# Patient Record
Sex: Female | Born: 1940 | Race: White | Hispanic: No | Smoking: Former smoker
Health system: Southern US, Community
[De-identification: ages and names within clinical notes are randomized; demographics above are authoritative.]

## PROBLEM LIST (undated history)

## (undated) DIAGNOSIS — Z87442 Personal history of urinary calculi: Secondary | ICD-10-CM

## (undated) DIAGNOSIS — M1990 Unspecified osteoarthritis, unspecified site: Secondary | ICD-10-CM

## (undated) DIAGNOSIS — J302 Other seasonal allergic rhinitis: Secondary | ICD-10-CM

## (undated) DIAGNOSIS — N183 Chronic kidney disease, stage 3 unspecified: Secondary | ICD-10-CM

## (undated) DIAGNOSIS — R1310 Dysphagia, unspecified: Secondary | ICD-10-CM

## (undated) DIAGNOSIS — E538 Deficiency of other specified B group vitamins: Secondary | ICD-10-CM

## (undated) DIAGNOSIS — M869 Osteomyelitis, unspecified: Secondary | ICD-10-CM

## (undated) DIAGNOSIS — G0490 Encephalitis and encephalomyelitis, unspecified: Secondary | ICD-10-CM

## (undated) DIAGNOSIS — R112 Nausea with vomiting, unspecified: Secondary | ICD-10-CM

## (undated) DIAGNOSIS — C801 Malignant (primary) neoplasm, unspecified: Secondary | ICD-10-CM

## (undated) DIAGNOSIS — Z9889 Other specified postprocedural states: Secondary | ICD-10-CM

## (undated) DIAGNOSIS — I10 Essential (primary) hypertension: Secondary | ICD-10-CM

## (undated) DIAGNOSIS — M810 Age-related osteoporosis without current pathological fracture: Secondary | ICD-10-CM

## (undated) DIAGNOSIS — K219 Gastro-esophageal reflux disease without esophagitis: Secondary | ICD-10-CM

## (undated) DIAGNOSIS — F32A Depression, unspecified: Secondary | ICD-10-CM

## (undated) DIAGNOSIS — J449 Chronic obstructive pulmonary disease, unspecified: Secondary | ICD-10-CM

## (undated) DIAGNOSIS — E785 Hyperlipidemia, unspecified: Secondary | ICD-10-CM

## (undated) DIAGNOSIS — E559 Vitamin D deficiency, unspecified: Secondary | ICD-10-CM

## (undated) HISTORY — PX: TONSILLECTOMY: SUR1361

## (undated) HISTORY — PX: BRAIN SURGERY: SHX531

## (undated) HISTORY — PX: ABDOMINAL HYSTERECTOMY: SHX81

---

## 1978-11-22 HISTORY — PX: BRAIN SURGERY: SHX531

## 2005-08-17 ENCOUNTER — Ambulatory Visit: Payer: Self-pay | Admitting: Internal Medicine

## 2007-03-01 ENCOUNTER — Ambulatory Visit: Payer: Self-pay | Admitting: Internal Medicine

## 2008-05-09 ENCOUNTER — Ambulatory Visit: Payer: Self-pay | Admitting: Internal Medicine

## 2008-06-19 ENCOUNTER — Ambulatory Visit: Payer: Self-pay | Admitting: Internal Medicine

## 2009-05-13 ENCOUNTER — Ambulatory Visit: Payer: Self-pay | Admitting: Internal Medicine

## 2009-05-27 ENCOUNTER — Ambulatory Visit: Payer: Self-pay | Admitting: Internal Medicine

## 2009-12-22 ENCOUNTER — Ambulatory Visit: Payer: Self-pay | Admitting: Internal Medicine

## 2010-05-19 ENCOUNTER — Ambulatory Visit: Payer: Self-pay | Admitting: Internal Medicine

## 2011-06-24 ENCOUNTER — Ambulatory Visit: Payer: Self-pay | Admitting: Internal Medicine

## 2012-06-28 ENCOUNTER — Ambulatory Visit: Payer: Self-pay | Admitting: Internal Medicine

## 2012-08-20 ENCOUNTER — Ambulatory Visit: Payer: Self-pay | Admitting: Internal Medicine

## 2012-12-24 ENCOUNTER — Ambulatory Visit: Payer: Self-pay | Admitting: Physician Assistant

## 2013-05-15 ENCOUNTER — Ambulatory Visit: Payer: Self-pay | Admitting: Rheumatology

## 2013-06-29 ENCOUNTER — Ambulatory Visit: Payer: Self-pay | Admitting: Internal Medicine

## 2013-07-04 ENCOUNTER — Ambulatory Visit: Payer: Self-pay | Admitting: Internal Medicine

## 2013-08-24 ENCOUNTER — Ambulatory Visit: Payer: Self-pay | Admitting: Emergency Medicine

## 2013-10-28 ENCOUNTER — Ambulatory Visit: Payer: Self-pay | Admitting: Physician Assistant

## 2013-10-28 LAB — RAPID STREP-A WITH REFLX: Micro Text Report: NEGATIVE

## 2014-07-04 DIAGNOSIS — M810 Age-related osteoporosis without current pathological fracture: Secondary | ICD-10-CM | POA: Insufficient documentation

## 2014-07-19 ENCOUNTER — Ambulatory Visit: Payer: Self-pay | Admitting: Internal Medicine

## 2014-12-31 DIAGNOSIS — H5210 Myopia, unspecified eye: Secondary | ICD-10-CM | POA: Diagnosis not present

## 2014-12-31 DIAGNOSIS — Z961 Presence of intraocular lens: Secondary | ICD-10-CM | POA: Diagnosis not present

## 2014-12-31 DIAGNOSIS — H524 Presbyopia: Secondary | ICD-10-CM | POA: Diagnosis not present

## 2015-01-25 ENCOUNTER — Observation Stay: Payer: Self-pay | Admitting: Internal Medicine

## 2015-01-25 DIAGNOSIS — F17210 Nicotine dependence, cigarettes, uncomplicated: Secondary | ICD-10-CM | POA: Diagnosis not present

## 2015-01-25 DIAGNOSIS — G9389 Other specified disorders of brain: Secondary | ICD-10-CM | POA: Diagnosis not present

## 2015-01-25 DIAGNOSIS — R42 Dizziness and giddiness: Secondary | ICD-10-CM | POA: Diagnosis not present

## 2015-01-25 DIAGNOSIS — J45909 Unspecified asthma, uncomplicated: Secondary | ICD-10-CM | POA: Diagnosis not present

## 2015-01-25 DIAGNOSIS — R209 Unspecified disturbances of skin sensation: Secondary | ICD-10-CM | POA: Diagnosis not present

## 2015-01-25 DIAGNOSIS — I10 Essential (primary) hypertension: Secondary | ICD-10-CM | POA: Diagnosis not present

## 2015-01-25 DIAGNOSIS — H539 Unspecified visual disturbance: Secondary | ICD-10-CM | POA: Diagnosis not present

## 2015-01-25 DIAGNOSIS — E785 Hyperlipidemia, unspecified: Secondary | ICD-10-CM | POA: Diagnosis not present

## 2015-01-25 DIAGNOSIS — F419 Anxiety disorder, unspecified: Secondary | ICD-10-CM | POA: Diagnosis not present

## 2015-01-25 DIAGNOSIS — R29810 Facial weakness: Secondary | ICD-10-CM | POA: Diagnosis not present

## 2015-01-25 DIAGNOSIS — R51 Headache: Secondary | ICD-10-CM | POA: Diagnosis not present

## 2015-01-25 DIAGNOSIS — K219 Gastro-esophageal reflux disease without esophagitis: Secondary | ICD-10-CM | POA: Diagnosis not present

## 2015-01-25 DIAGNOSIS — J449 Chronic obstructive pulmonary disease, unspecified: Secondary | ICD-10-CM | POA: Diagnosis not present

## 2015-01-26 DIAGNOSIS — G9389 Other specified disorders of brain: Secondary | ICD-10-CM | POA: Diagnosis not present

## 2015-01-26 DIAGNOSIS — J449 Chronic obstructive pulmonary disease, unspecified: Secondary | ICD-10-CM | POA: Diagnosis not present

## 2015-01-26 DIAGNOSIS — J45909 Unspecified asthma, uncomplicated: Secondary | ICD-10-CM | POA: Diagnosis not present

## 2015-01-26 DIAGNOSIS — R42 Dizziness and giddiness: Secondary | ICD-10-CM | POA: Diagnosis not present

## 2015-01-26 DIAGNOSIS — E785 Hyperlipidemia, unspecified: Secondary | ICD-10-CM | POA: Diagnosis not present

## 2015-01-26 DIAGNOSIS — I10 Essential (primary) hypertension: Secondary | ICD-10-CM | POA: Diagnosis not present

## 2015-01-26 DIAGNOSIS — K219 Gastro-esophageal reflux disease without esophagitis: Secondary | ICD-10-CM | POA: Diagnosis not present

## 2015-01-26 DIAGNOSIS — F17210 Nicotine dependence, cigarettes, uncomplicated: Secondary | ICD-10-CM | POA: Diagnosis not present

## 2015-01-26 DIAGNOSIS — F419 Anxiety disorder, unspecified: Secondary | ICD-10-CM | POA: Diagnosis not present

## 2015-01-26 DIAGNOSIS — G459 Transient cerebral ischemic attack, unspecified: Secondary | ICD-10-CM | POA: Diagnosis not present

## 2015-01-29 DIAGNOSIS — J449 Chronic obstructive pulmonary disease, unspecified: Secondary | ICD-10-CM | POA: Diagnosis not present

## 2015-01-29 DIAGNOSIS — I10 Essential (primary) hypertension: Secondary | ICD-10-CM | POA: Diagnosis not present

## 2015-01-29 DIAGNOSIS — M1990 Unspecified osteoarthritis, unspecified site: Secondary | ICD-10-CM | POA: Diagnosis not present

## 2015-03-03 DIAGNOSIS — M1990 Unspecified osteoarthritis, unspecified site: Secondary | ICD-10-CM | POA: Diagnosis not present

## 2015-03-03 DIAGNOSIS — J449 Chronic obstructive pulmonary disease, unspecified: Secondary | ICD-10-CM | POA: Diagnosis not present

## 2015-03-03 DIAGNOSIS — I10 Essential (primary) hypertension: Secondary | ICD-10-CM | POA: Diagnosis not present

## 2015-03-03 DIAGNOSIS — E780 Pure hypercholesterolemia: Secondary | ICD-10-CM | POA: Diagnosis not present

## 2015-03-23 NOTE — H&P (Signed)
PATIENT NAME:  Katherine Mosley, MELNIK MR#:  [id number] DATE OF BIRTH:  [birth year]  DATE OF ADMISSION:  01/25/2015  REFERRING PHYSICIAN: Wells Guiles L. Reita Cliche, MD   FAMILY PHYSICIAN: Leonie Douglas. Doy Hutching, MD   REASON FOR ADMISSION: Accelerated hypertension with severe dizziness.   HISTORY OF PRESENT ILLNESS: The patient is a 74 year old female with a significant history of previous encephalitis with resulting encephalomalacia. Also has a history of hypertension, hyperlipidemia, and asthma. Presents to the Emergency Room with severe left-sided headache associated with dizziness. In the Emergency Room, the patient's blood pressure was extremely elevated. Laboratories and CT were unremarkable. She continues to complain of dizziness and headache and is now admitted for further evaluation. She is unable to get an MRI of the brain done due to previous surgery related to her encephalitis.   PAST MEDICAL HISTORY: 1.  History of encephalitis status post craniotomy.  2.  Resulting encephalomalacia.  3.  Asthma.  4.  Benign hypertension.  5.  Hyperlipidemia.  6.  GE reflux disease.  7.  Status post hysterectomy.   MEDICATIONS: 1.  Zantac 150 mg p.o. b.i.d.  2.  Pravastatin 40 mg p.o. at bedtime.  3.  Multivitamin 1 p.o. daily.  4.  Meloxicam 7.5 mg p.o. daily.  5.  Losartan 100 mg p.o. daily.  6.  Claritin 10 mg p.o. daily.  7.  Aspirin 81 mg p.o. daily.  8.  Amlodipine 5 mg p.o. daily.  9.  Fosamax 70 mg p.o. q. week.   ALLERGIES: SULFA AND PREDNISONE.   SOCIAL HISTORY: The patient still smokes. Denies alcohol abuse.   FAMILY HISTORY: Positive for hypertension, stroke, diabetes, and coronary artery disease.   REVIEW OF SYSTEMS:  CONSTITUTIONAL: No fever or change in weight.  EYES: No blurred or double vision. No glaucoma.  ENT: No tinnitus or hearing loss. No nasal discharge or bleeding. No difficulty swallowing.  RESPIRATORY: No cough or wheezing. Denies hemoptysis.  CARDIOVASCULAR: No chest pain or  orthopnea. No palpitations or syncope.  GASTROINTESTINAL: No nausea, vomiting, or diarrhea. No abdominal pain. No change in bowel habits.  GENITOURINARY: No dysuria or hematuria. No incontinence.  ENDOCRINE: No polyuria or polydipsia. No heat or cold intolerance.  HEMATOLOGIC: The patient denies anemia, easy bruising, or bleeding.  LYMPHATIC: No swollen glands.  MUSCULOSKELETAL: The patient has pain in her neck, back, shoulders, knees, and hips. No gout.  NEUROLOGIC: Denies migraines. No previous stroke or seizures.  PSYCHOLOGICAL: The patient denies anxiety, insomnia or depression.   PHYSICAL EXAMINATION: GENERAL: The patient is in no acute distress.  VITAL SIGNS: Currently remarkable for initial blood pressure of 190/110, now 162/85 with a heart rate of 95, respiratory rate of 18, temperature of 98, saturation 98% on room air.  HEENT: Normocephalic, atraumatic. Pupils equal, round, and reactive to light and accommodation. Extraocular movements are intact. Sclerae are anicteric. Conjunctivae are clear. Oropharynx was clear.  NECK: Supple without JVD. No adenopathy or thyromegaly is noted.  LUNGS: Clear to auscultation and percussion without wheezes, rales or rhonchi. No dullness. Respiratory effort is normal.  CARDIAC: Regular rate and rhythm. Normal S1, S2. No significant rubs, murmurs or gallops. PMI is nondisplaced. Chest wall is nontender.  ABDOMEN: Soft, nontender with normoactive bowel sounds. No organomegaly or masses were appreciated. No hernias or bruits were noted.  EXTREMITIES: Without clubbing, cyanosis, edema. Pulses were 2+ bilaterally.  SKIN: Warm and dry without rash or lesions.  NEUROLOGIC: Cranial nerves II through XII grossly intact. Deep tendon reflexes were symmetric. Motor  and sensory examination is nonfocal.  PSYCHIATRIC: Revealed a patient who is alert and oriented to person, place, and time. She was cooperative and used good judgment.   LABORATORY DATA: EKG revealed  sinus rhythm with no acute ischemic changes. Head CT revealed evidence of encephalomalacia with no acute infarct or bleed. Her white count was 9.9 with a hemoglobin of 15.1. Glucose 99 with a BUN of 9, creatinine 1.07 with a GFR of 53. Troponin was less than 0.02.   ASSESSMENT: 1.  Acute headache with dizziness, worrisome for stroke.  2.  Accelerated hypertension.  3.  Encephalomalacia.  4.  Chronic obstructive pulmonary disease/asthma.  5.  Anxiety.  6.  History of encephalitis.   PLAN: The patient is unable to get an MRI of the brain due to her previous brain surgery. We will observe on telemetry overnight and monitor her blood pressure closely. We will add empirically a low-dose beta blocker. Neuro checks q. 4 hours. We will repeat head CT in the morning and discharge home if stable. Further treatment and evaluation will depend upon the patient's progress.   TOTAL TIME SPENT ON THIS PATIENT: 45 minutes.     ____________________________ Leonie Douglas Doy Hutching, MD jds:at D: 01/25/2015 15:43:12 ET T: 01/25/2015 16:02:32 ET JOB#: [id number]  cc: Leonie Douglas. Doy Hutching, MD, <Dictator> Brysun Eschmann Lennice Sites MD ELECTRONICALLY SIGNED 01/25/2015 20:22

## 2015-07-02 DIAGNOSIS — Z79899 Other long term (current) drug therapy: Secondary | ICD-10-CM | POA: Diagnosis not present

## 2015-07-02 DIAGNOSIS — E780 Pure hypercholesterolemia: Secondary | ICD-10-CM | POA: Diagnosis not present

## 2015-07-02 DIAGNOSIS — I10 Essential (primary) hypertension: Secondary | ICD-10-CM | POA: Diagnosis not present

## 2015-07-09 ENCOUNTER — Other Ambulatory Visit: Payer: Self-pay | Admitting: Internal Medicine

## 2015-07-09 DIAGNOSIS — Z0000 Encounter for general adult medical examination without abnormal findings: Secondary | ICD-10-CM | POA: Diagnosis not present

## 2015-07-09 DIAGNOSIS — E780 Pure hypercholesterolemia: Secondary | ICD-10-CM | POA: Diagnosis not present

## 2015-07-09 DIAGNOSIS — R5383 Other fatigue: Secondary | ICD-10-CM | POA: Diagnosis not present

## 2015-07-09 DIAGNOSIS — Z1231 Encounter for screening mammogram for malignant neoplasm of breast: Secondary | ICD-10-CM

## 2015-07-09 DIAGNOSIS — J431 Panlobular emphysema: Secondary | ICD-10-CM | POA: Diagnosis not present

## 2015-07-09 DIAGNOSIS — J449 Chronic obstructive pulmonary disease, unspecified: Secondary | ICD-10-CM | POA: Diagnosis not present

## 2015-07-09 DIAGNOSIS — I10 Essential (primary) hypertension: Secondary | ICD-10-CM | POA: Diagnosis not present

## 2015-07-09 DIAGNOSIS — R5381 Other malaise: Secondary | ICD-10-CM | POA: Diagnosis not present

## 2015-07-18 DIAGNOSIS — Z1211 Encounter for screening for malignant neoplasm of colon: Secondary | ICD-10-CM | POA: Diagnosis not present

## 2015-07-22 ENCOUNTER — Ambulatory Visit
Admission: RE | Admit: 2015-07-22 | Discharge: 2015-07-22 | Disposition: A | Discharge status: Home / Self Care | Payer: Commercial Managed Care - HMO | Source: Ambulatory Visit | Attending: Internal Medicine | Admitting: Internal Medicine

## 2015-07-22 DIAGNOSIS — Z1231 Encounter for screening mammogram for malignant neoplasm of breast: Secondary | ICD-10-CM | POA: Insufficient documentation

## 2015-08-18 DIAGNOSIS — M79674 Pain in right toe(s): Secondary | ICD-10-CM | POA: Diagnosis not present

## 2015-08-18 DIAGNOSIS — B351 Tinea unguium: Secondary | ICD-10-CM | POA: Diagnosis not present

## 2015-08-20 DIAGNOSIS — E559 Vitamin D deficiency, unspecified: Secondary | ICD-10-CM | POA: Diagnosis not present

## 2015-08-20 DIAGNOSIS — R5381 Other malaise: Secondary | ICD-10-CM | POA: Diagnosis not present

## 2015-08-20 DIAGNOSIS — E780 Pure hypercholesterolemia: Secondary | ICD-10-CM | POA: Diagnosis not present

## 2015-08-20 DIAGNOSIS — I10 Essential (primary) hypertension: Secondary | ICD-10-CM | POA: Diagnosis not present

## 2015-08-20 DIAGNOSIS — Z79899 Other long term (current) drug therapy: Secondary | ICD-10-CM | POA: Diagnosis not present

## 2015-08-20 DIAGNOSIS — R5383 Other fatigue: Secondary | ICD-10-CM | POA: Diagnosis not present

## 2015-10-25 ENCOUNTER — Encounter: Payer: Self-pay | Admitting: Emergency Medicine

## 2015-10-25 ENCOUNTER — Ambulatory Visit
Admission: EM | Admit: 2015-10-25 | Discharge: 2015-10-25 | Disposition: A | Discharge status: Home / Self Care | Payer: Commercial Managed Care - HMO | Attending: Family Medicine | Admitting: Family Medicine

## 2015-10-25 DIAGNOSIS — J069 Acute upper respiratory infection, unspecified: Secondary | ICD-10-CM

## 2015-10-25 HISTORY — DX: Essential (primary) hypertension: I10

## 2015-10-25 HISTORY — DX: Hyperlipidemia, unspecified: E78.5

## 2015-10-25 HISTORY — DX: Other seasonal allergic rhinitis: J30.2

## 2015-10-25 MED ORDER — GUAIFENESIN ER 600 MG PO TB12: 600.0000 mg | ORAL_TABLET | Freq: Two times a day (BID) | ORAL | Status: DC | PRN

## 2015-10-25 MED ORDER — MOMETASONE FUROATE 50 MCG/ACT NA SUSP: 2.0000 | Freq: Two times a day (BID) | NASAL | Status: DC

## 2015-10-25 NOTE — Discharge Instructions (Signed)
Upper Respiratory Infection, Adult Most upper respiratory infections (URIs) are a viral infection of the air passages leading to the lungs. A URI affects the nose, throat, and upper air passages. The most common type of URI is nasopharyngitis and is typically referred to as "the common cold." URIs run their course and usually go away on their own. Most of the time, a URI does not require medical attention, but sometimes a bacterial infection in the upper airways can follow a viral infection. This is called a secondary infection. Sinus and middle ear infections are common types of secondary upper respiratory infections. Bacterial pneumonia can also complicate a URI. A URI can worsen asthma and chronic obstructive pulmonary disease (COPD). Sometimes, these complications can require emergency medical care and may be life threatening.  CAUSES Almost all URIs are caused by viruses. A virus is a type of germ and can spread from one person to another.  RISKS FACTORS You may be at risk for a URI if:   You smoke.   You have chronic heart or lung disease.  You have a weakened defense (immune) system.   You are very young or very old.   You have nasal allergies or asthma.  You work in crowded or poorly ventilated areas.  You work in health care facilities or schools. SIGNS AND SYMPTOMS  Symptoms typically develop 2-3 days after you come in contact with a cold virus. Most viral URIs last 7-10 days. However, viral URIs from the influenza virus (flu virus) can last 14-18 days and are typically more severe. Symptoms may include:   Runny or stuffy (congested) nose.   Sneezing.   Cough.   Sore throat.   Headache.   Fatigue.   Fever.   Loss of appetite.   Pain in your forehead, behind your eyes, and over your cheekbones (sinus pain).  Muscle aches.  DIAGNOSIS  Your health care provider may diagnose a URI by:  Physical exam.  Tests to check that your symptoms are not due to  another condition such as:  Strep throat.  Sinusitis.  Pneumonia.  Asthma. TREATMENT  A URI goes away on its own with time. It cannot be cured with medicines, but medicines may be prescribed or recommended to relieve symptoms. Medicines may help:  Reduce your fever.  Reduce your cough.  Relieve nasal congestion. HOME CARE INSTRUCTIONS   Take medicines only as directed by your health care provider.   Gargle warm saltwater or take cough drops to comfort your throat as directed by your health care provider.  Use a warm mist humidifier or inhale steam from a shower to increase air moisture. This may make it easier to breathe.  Drink enough fluid to keep your urine clear or pale yellow.   Eat soups and other clear broths and maintain good nutrition.   Rest as needed.   Return to work when your temperature has returned to normal or as your health care provider advises. You may need to stay home longer to avoid infecting others. You can also use a face mask and careful hand washing to prevent spread of the virus.  Increase the usage of your inhaler if you have asthma.   Do not use any tobacco products, including cigarettes, chewing tobacco, or electronic cigarettes. If you need help quitting, ask your health care provider. PREVENTION  The best way to protect yourself from getting a cold is to practice good hygiene.   Avoid oral or hand contact with people with cold   symptoms.   Wash your hands often if contact occurs.  There is no clear evidence that vitamin C, vitamin E, echinacea, or exercise reduces the chance of developing a cold. However, it is always recommended to get plenty of rest, exercise, and practice good nutrition.  SEEK MEDICAL CARE IF:   You are getting worse rather than better.   Your symptoms are not controlled by medicine.   You have chills.  You have worsening shortness of breath.  You have brown or red mucus.  You have yellow or brown nasal  discharge.  You have pain in your face, especially when you bend forward.  You have a fever.  You have swollen neck glands.  You have pain while swallowing.  You have white areas in the back of your throat. SEEK IMMEDIATE MEDICAL CARE IF:   You have severe or persistent:  Headache.  Ear pain.  Sinus pain.  Chest pain.  You have chronic lung disease and any of the following:  Wheezing.  Prolonged cough.  Coughing up blood.  A change in your usual mucus.  You have a stiff neck.  You have changes in your:  Vision.  Hearing.  Thinking.  Mood. MAKE SURE YOU:   Understand these instructions.  Will watch your condition.  Will get help right away if you are not doing well or get worse.   This information is not intended to replace advice given to you by your health care provider. Make sure you discuss any questions you have with your health care provider.   Document Released: 05/04/2001 Document Revised: 03/25/2015 Document Reviewed: 02/13/2014 Elsevier Interactive Patient Education 2016 [employer]  

## 2015-10-25 NOTE — ED Provider Notes (Signed)
CSN: [id number]     Arrival date & time 10/25/15  1003 History   First MD Initiated Contact with Patient 10/25/15 1041     Chief Complaint  Patient presents with  . Cough   HPI  Katherine Mosley is a pleasant 74 y.o. female who presents with acute nonproductive cough, bilateral ear pressure, rhinorrhea, and sore throat. She states this started one week ago. Her current pain level is 0/10. She has fatigue but denies any myalgias. She has chills but denies any fever. She has tried Tylenol as needed as well as saline nasal spray with minimal relief. She's also been using [facility] throat drops. She did not receive of influenza vaccine this year. She has a 50+ pack year history of tobacco use. Rest seems to make her symptoms better & nothing exacerbates her symptoms.    Past Medical History  Diagnosis Date  . Hypertension   . Hyperlipidemia   . Seasonal allergies    Past Surgical History  Procedure Laterality Date  . Abdominal hysterectomy    . Tonsillectomy    . Brain surgery     History reviewed. No pertinent family history. Social History  Substance Use Topics  . Smoking status: Current Every Day Smoker  . Smokeless tobacco: Never Used  . Alcohol Use: No   OB History    No data available     Review of Systems  Constitutional: Positive for chills, activity change, appetite change and fatigue.  HENT: Positive for congestion, ear pain, postnasal drip, rhinorrhea, sneezing and sore throat. Negative for sinus pressure and tinnitus.   Eyes: Negative.   Respiratory: Positive for cough. Negative for shortness of breath and wheezing.   Cardiovascular: Negative.   Gastrointestinal: Negative.   Endocrine: Negative.   Genitourinary: Negative.   Musculoskeletal: Negative.   Skin: Negative.   Neurological: Negative.   Hematological: Negative.   Psychiatric/Behavioral: Negative.     Allergies  Prednisone and Sulfur  Home Medications   Prior to Admission medications    Medication Sig Start Date End Date Taking? Authorizing Provider  acetaminophen (TYLENOL) 500 MG tablet Take 500 mg by mouth every 6 (six) hours as needed.   Yes Historical Provider, MD  alendronate (FOSAMAX) 70 MG tablet Take 70 mg by mouth once a week. Take with a full glass of water on an empty stomach.   Yes Historical Provider, MD  aspirin 81 MG tablet Take 81 mg by mouth daily.   Yes Historical Provider, MD  calcium-vitamin D (OSCAL WITH D) 500-200 MG-UNIT tablet Take 1 tablet by mouth.   Yes Historical Provider, MD  loratadine (CLARITIN) 10 MG tablet Take 10 mg by mouth daily.   Yes Historical Provider, MD  losartan (COZAAR) 100 MG tablet Take 100 mg by mouth daily.   Yes Historical Provider, MD  metoprolol (LOPRESSOR) 50 MG tablet Take 50 mg by mouth once.   Yes Historical Provider, MD  pravastatin (PRAVACHOL) 40 MG tablet Take 40 mg by mouth daily.   Yes Historical Provider, MD  ranitidine (ZANTAC) 150 MG tablet Take 150 mg by mouth 2 (two) times daily.   Yes Historical Provider, MD  guaiFENesin (MUCINEX) 600 MG 12 hr tablet Take 1 tablet (600 mg total) by mouth 2 (two) times daily as needed. 10/25/15   Andria Meuse, NP  mometasone (NASONEX) 50 MCG/ACT nasal spray Place 2 sprays into the nose 2 (two) times daily. 10/25/15   Andria Meuse, NP   Meds Ordered and Administered this  Visit  Medications - No data to display  BP 135/65 mmHg  Pulse 60  Temp(Src) 97.5 F (36.4 C) (Tympanic)  Resp 16  Ht 5\' 1"  (1.549 m)  Wt 135 lb (61.236 kg)  BMI 25.52 kg/m2  SpO2 98% No data found.   Physical Exam  Constitutional: She is oriented to person, place, and time. She appears well-developed and well-nourished. No distress.  HENT:  Head: Normocephalic and atraumatic.  Right Ear: Hearing, external ear and ear canal normal. Tympanic membrane is injected. Tympanic membrane is not erythematous, not retracted and not bulging. No middle ear effusion.  Left Ear: Hearing, external ear and ear  canal normal. Tympanic membrane is injected. Tympanic membrane is not erythematous, not retracted and not bulging.  No middle ear effusion.  Nose: Mucosal edema and rhinorrhea present. Right sinus exhibits no maxillary sinus tenderness and no frontal sinus tenderness. Left sinus exhibits no maxillary sinus tenderness and no frontal sinus tenderness.  Mouth/Throat: Uvula is midline, oropharynx is clear and moist and mucous membranes are normal.  Eyes: Conjunctivae are normal. No scleral icterus.  Neck: Normal range of motion.  Cardiovascular: Normal rate and regular rhythm.   Pulmonary/Chest: Effort normal and breath sounds normal. No accessory muscle usage. No respiratory distress.  Abdominal: Soft. Bowel sounds are normal. She exhibits no distension.  Musculoskeletal: Normal range of motion. She exhibits no edema or tenderness.  Neurological: She is alert and oriented to person, place, and time. No cranial nerve deficit.  Skin: Skin is warm and dry. No rash noted. No erythema.  Psychiatric: Her behavior is normal. Judgment normal.  Nursing note and vitals reviewed.   ED Course  Procedures n/a  MDM   1. URI, acute    Explained to patient this is likely viral URI. Cough and symptoms may persist for up to 3 weeks at times. Continue supportive measures. Reassured and discussed warning signs for patient to return to clinic including SOB, weakness, unrelenting fever, severe pain or worsening symptoms.  Plan: Diagnosis reviewed with patient Tylenol as directed PRN Rx as per orders;  benefits, risks, potential side effects reviewed  Seek additional medical care if symptoms worsen or are not improving     Andria Meuse, NP 10/25/15 1101

## 2015-10-25 NOTE — ED Notes (Signed)
Cough, throat burning, congested, nasal drainage (Yellow sputum) for 1 week

## 2015-12-29 DIAGNOSIS — Z961 Presence of intraocular lens: Secondary | ICD-10-CM | POA: Diagnosis not present

## 2015-12-29 DIAGNOSIS — H524 Presbyopia: Secondary | ICD-10-CM | POA: Diagnosis not present

## 2015-12-29 DIAGNOSIS — H5210 Myopia, unspecified eye: Secondary | ICD-10-CM | POA: Diagnosis not present

## 2016-01-13 DIAGNOSIS — E7800 Pure hypercholesterolemia, unspecified: Secondary | ICD-10-CM | POA: Diagnosis not present

## 2016-01-13 DIAGNOSIS — Z79899 Other long term (current) drug therapy: Secondary | ICD-10-CM | POA: Diagnosis not present

## 2016-01-13 DIAGNOSIS — R5381 Other malaise: Secondary | ICD-10-CM | POA: Diagnosis not present

## 2016-01-13 DIAGNOSIS — E559 Vitamin D deficiency, unspecified: Secondary | ICD-10-CM | POA: Diagnosis not present

## 2016-01-13 DIAGNOSIS — E538 Deficiency of other specified B group vitamins: Secondary | ICD-10-CM | POA: Diagnosis not present

## 2016-01-13 DIAGNOSIS — I10 Essential (primary) hypertension: Secondary | ICD-10-CM | POA: Diagnosis not present

## 2016-01-13 DIAGNOSIS — R5383 Other fatigue: Secondary | ICD-10-CM | POA: Diagnosis not present

## 2016-01-20 DIAGNOSIS — Z79899 Other long term (current) drug therapy: Secondary | ICD-10-CM | POA: Diagnosis not present

## 2016-01-20 DIAGNOSIS — E7800 Pure hypercholesterolemia, unspecified: Secondary | ICD-10-CM | POA: Diagnosis not present

## 2016-01-20 DIAGNOSIS — E538 Deficiency of other specified B group vitamins: Secondary | ICD-10-CM | POA: Diagnosis not present

## 2016-01-20 DIAGNOSIS — I10 Essential (primary) hypertension: Secondary | ICD-10-CM | POA: Diagnosis not present

## 2016-01-20 DIAGNOSIS — E559 Vitamin D deficiency, unspecified: Secondary | ICD-10-CM | POA: Diagnosis not present

## 2016-01-20 DIAGNOSIS — J431 Panlobular emphysema: Secondary | ICD-10-CM | POA: Diagnosis not present

## 2016-01-20 DIAGNOSIS — M1990 Unspecified osteoarthritis, unspecified site: Secondary | ICD-10-CM | POA: Diagnosis not present

## 2016-04-12 DIAGNOSIS — E7800 Pure hypercholesterolemia, unspecified: Secondary | ICD-10-CM | POA: Diagnosis not present

## 2016-04-12 DIAGNOSIS — Z79899 Other long term (current) drug therapy: Secondary | ICD-10-CM | POA: Diagnosis not present

## 2016-04-12 DIAGNOSIS — E559 Vitamin D deficiency, unspecified: Secondary | ICD-10-CM | POA: Diagnosis not present

## 2016-04-12 DIAGNOSIS — E538 Deficiency of other specified B group vitamins: Secondary | ICD-10-CM | POA: Diagnosis not present

## 2016-04-20 DIAGNOSIS — E7800 Pure hypercholesterolemia, unspecified: Secondary | ICD-10-CM | POA: Diagnosis not present

## 2016-04-20 DIAGNOSIS — Z1239 Encounter for other screening for malignant neoplasm of breast: Secondary | ICD-10-CM | POA: Diagnosis not present

## 2016-04-20 DIAGNOSIS — M1990 Unspecified osteoarthritis, unspecified site: Secondary | ICD-10-CM | POA: Diagnosis not present

## 2016-04-20 DIAGNOSIS — J431 Panlobular emphysema: Secondary | ICD-10-CM | POA: Diagnosis not present

## 2016-04-20 DIAGNOSIS — K219 Gastro-esophageal reflux disease without esophagitis: Secondary | ICD-10-CM | POA: Diagnosis not present

## 2016-04-20 DIAGNOSIS — Z79899 Other long term (current) drug therapy: Secondary | ICD-10-CM | POA: Diagnosis not present

## 2016-04-20 DIAGNOSIS — I10 Essential (primary) hypertension: Secondary | ICD-10-CM | POA: Diagnosis not present

## 2016-07-16 DIAGNOSIS — Z79899 Other long term (current) drug therapy: Secondary | ICD-10-CM | POA: Diagnosis not present

## 2016-07-16 DIAGNOSIS — I10 Essential (primary) hypertension: Secondary | ICD-10-CM | POA: Diagnosis not present

## 2016-07-16 DIAGNOSIS — E7800 Pure hypercholesterolemia, unspecified: Secondary | ICD-10-CM | POA: Diagnosis not present

## 2016-07-23 DIAGNOSIS — Z0000 Encounter for general adult medical examination without abnormal findings: Secondary | ICD-10-CM | POA: Diagnosis not present

## 2016-07-23 DIAGNOSIS — I10 Essential (primary) hypertension: Secondary | ICD-10-CM | POA: Diagnosis not present

## 2016-07-23 DIAGNOSIS — Z1211 Encounter for screening for malignant neoplasm of colon: Secondary | ICD-10-CM | POA: Diagnosis not present

## 2016-07-23 DIAGNOSIS — J431 Panlobular emphysema: Secondary | ICD-10-CM | POA: Diagnosis not present

## 2016-07-23 DIAGNOSIS — E7800 Pure hypercholesterolemia, unspecified: Secondary | ICD-10-CM | POA: Diagnosis not present

## 2016-07-23 DIAGNOSIS — Z79899 Other long term (current) drug therapy: Secondary | ICD-10-CM | POA: Diagnosis not present

## 2016-07-30 DIAGNOSIS — Z1211 Encounter for screening for malignant neoplasm of colon: Secondary | ICD-10-CM | POA: Diagnosis not present

## 2016-08-02 ENCOUNTER — Other Ambulatory Visit: Payer: Self-pay | Admitting: Internal Medicine

## 2016-08-02 DIAGNOSIS — Z1231 Encounter for screening mammogram for malignant neoplasm of breast: Secondary | ICD-10-CM

## 2016-08-20 ENCOUNTER — Ambulatory Visit
Admission: RE | Admit: 2016-08-20 | Discharge: 2016-08-20 | Disposition: A | Discharge status: Home / Self Care | Payer: Commercial Managed Care - HMO | Source: Ambulatory Visit | Attending: Internal Medicine | Admitting: Internal Medicine

## 2016-08-20 DIAGNOSIS — Z1231 Encounter for screening mammogram for malignant neoplasm of breast: Secondary | ICD-10-CM | POA: Diagnosis not present

## 2016-11-09 DIAGNOSIS — S6992XA Unspecified injury of left wrist, hand and finger(s), initial encounter: Secondary | ICD-10-CM | POA: Diagnosis not present

## 2016-11-09 DIAGNOSIS — I10 Essential (primary) hypertension: Secondary | ICD-10-CM | POA: Diagnosis not present

## 2016-11-29 DIAGNOSIS — M5442 Lumbago with sciatica, left side: Secondary | ICD-10-CM | POA: Diagnosis not present

## 2016-11-29 DIAGNOSIS — M545 Low back pain: Secondary | ICD-10-CM | POA: Diagnosis not present

## 2016-11-29 DIAGNOSIS — I10 Essential (primary) hypertension: Secondary | ICD-10-CM | POA: Diagnosis not present

## 2016-12-13 DIAGNOSIS — I10 Essential (primary) hypertension: Secondary | ICD-10-CM | POA: Diagnosis not present

## 2017-01-11 DIAGNOSIS — J329 Chronic sinusitis, unspecified: Secondary | ICD-10-CM | POA: Diagnosis not present

## 2017-01-11 DIAGNOSIS — R21 Rash and other nonspecific skin eruption: Secondary | ICD-10-CM | POA: Diagnosis not present

## 2017-01-13 DIAGNOSIS — E7800 Pure hypercholesterolemia, unspecified: Secondary | ICD-10-CM | POA: Diagnosis not present

## 2017-01-13 DIAGNOSIS — I10 Essential (primary) hypertension: Secondary | ICD-10-CM | POA: Diagnosis not present

## 2017-01-13 DIAGNOSIS — Z79899 Other long term (current) drug therapy: Secondary | ICD-10-CM | POA: Diagnosis not present

## 2017-01-20 DIAGNOSIS — I10 Essential (primary) hypertension: Secondary | ICD-10-CM | POA: Diagnosis not present

## 2017-01-20 DIAGNOSIS — E538 Deficiency of other specified B group vitamins: Secondary | ICD-10-CM | POA: Diagnosis not present

## 2017-01-20 DIAGNOSIS — E559 Vitamin D deficiency, unspecified: Secondary | ICD-10-CM | POA: Diagnosis not present

## 2017-01-20 DIAGNOSIS — J431 Panlobular emphysema: Secondary | ICD-10-CM | POA: Diagnosis not present

## 2017-01-20 DIAGNOSIS — E7800 Pure hypercholesterolemia, unspecified: Secondary | ICD-10-CM | POA: Diagnosis not present

## 2017-01-20 DIAGNOSIS — Z79899 Other long term (current) drug therapy: Secondary | ICD-10-CM | POA: Diagnosis not present

## 2017-02-16 DIAGNOSIS — H524 Presbyopia: Secondary | ICD-10-CM | POA: Diagnosis not present

## 2017-02-16 DIAGNOSIS — Z961 Presence of intraocular lens: Secondary | ICD-10-CM | POA: Diagnosis not present

## 2017-03-01 DIAGNOSIS — L918 Other hypertrophic disorders of the skin: Secondary | ICD-10-CM | POA: Diagnosis not present

## 2017-03-01 DIAGNOSIS — D485 Neoplasm of uncertain behavior of skin: Secondary | ICD-10-CM | POA: Diagnosis not present

## 2017-03-01 DIAGNOSIS — C44319 Basal cell carcinoma of skin of other parts of face: Secondary | ICD-10-CM | POA: Diagnosis not present

## 2017-03-18 DIAGNOSIS — L578 Other skin changes due to chronic exposure to nonionizing radiation: Secondary | ICD-10-CM | POA: Diagnosis not present

## 2017-03-18 DIAGNOSIS — C44319 Basal cell carcinoma of skin of other parts of face: Secondary | ICD-10-CM | POA: Diagnosis not present

## 2017-03-18 DIAGNOSIS — D2311 Other benign neoplasm of skin of right eyelid, including canthus: Secondary | ICD-10-CM | POA: Diagnosis not present

## 2017-03-18 DIAGNOSIS — H029 Unspecified disorder of eyelid: Secondary | ICD-10-CM | POA: Diagnosis not present

## 2017-04-06 DIAGNOSIS — C44112 Basal cell carcinoma of skin of right eyelid, including canthus: Secondary | ICD-10-CM | POA: Diagnosis not present

## 2017-04-06 DIAGNOSIS — Z85828 Personal history of other malignant neoplasm of skin: Secondary | ICD-10-CM | POA: Diagnosis not present

## 2017-04-07 DIAGNOSIS — L578 Other skin changes due to chronic exposure to nonionizing radiation: Secondary | ICD-10-CM | POA: Diagnosis not present

## 2017-04-07 DIAGNOSIS — C44319 Basal cell carcinoma of skin of other parts of face: Secondary | ICD-10-CM | POA: Diagnosis not present

## 2017-07-11 DIAGNOSIS — Z85828 Personal history of other malignant neoplasm of skin: Secondary | ICD-10-CM | POA: Diagnosis not present

## 2017-07-11 DIAGNOSIS — Z9889 Other specified postprocedural states: Secondary | ICD-10-CM | POA: Diagnosis not present

## 2017-07-11 DIAGNOSIS — C44319 Basal cell carcinoma of skin of other parts of face: Secondary | ICD-10-CM | POA: Diagnosis not present

## 2017-07-11 DIAGNOSIS — H02812 Retained foreign body in right lower eyelid: Secondary | ICD-10-CM | POA: Diagnosis not present

## 2017-07-20 DIAGNOSIS — E538 Deficiency of other specified B group vitamins: Secondary | ICD-10-CM | POA: Diagnosis not present

## 2017-07-20 DIAGNOSIS — Z79899 Other long term (current) drug therapy: Secondary | ICD-10-CM | POA: Diagnosis not present

## 2017-07-20 DIAGNOSIS — E7800 Pure hypercholesterolemia, unspecified: Secondary | ICD-10-CM | POA: Diagnosis not present

## 2017-07-20 DIAGNOSIS — I10 Essential (primary) hypertension: Secondary | ICD-10-CM | POA: Diagnosis not present

## 2017-07-20 DIAGNOSIS — E559 Vitamin D deficiency, unspecified: Secondary | ICD-10-CM | POA: Diagnosis not present

## 2017-07-27 DIAGNOSIS — Z1231 Encounter for screening mammogram for malignant neoplasm of breast: Secondary | ICD-10-CM | POA: Diagnosis not present

## 2017-07-27 DIAGNOSIS — J431 Panlobular emphysema: Secondary | ICD-10-CM | POA: Diagnosis not present

## 2017-07-27 DIAGNOSIS — E559 Vitamin D deficiency, unspecified: Secondary | ICD-10-CM | POA: Diagnosis not present

## 2017-07-27 DIAGNOSIS — E538 Deficiency of other specified B group vitamins: Secondary | ICD-10-CM | POA: Diagnosis not present

## 2017-07-27 DIAGNOSIS — I10 Essential (primary) hypertension: Secondary | ICD-10-CM | POA: Diagnosis not present

## 2017-07-27 DIAGNOSIS — Z0000 Encounter for general adult medical examination without abnormal findings: Secondary | ICD-10-CM | POA: Diagnosis not present

## 2017-07-27 DIAGNOSIS — J449 Chronic obstructive pulmonary disease, unspecified: Secondary | ICD-10-CM | POA: Diagnosis not present

## 2017-07-27 DIAGNOSIS — Z1211 Encounter for screening for malignant neoplasm of colon: Secondary | ICD-10-CM | POA: Diagnosis not present

## 2017-07-27 DIAGNOSIS — E7800 Pure hypercholesterolemia, unspecified: Secondary | ICD-10-CM | POA: Diagnosis not present

## 2017-07-27 DIAGNOSIS — M25561 Pain in right knee: Secondary | ICD-10-CM | POA: Diagnosis not present

## 2017-08-02 ENCOUNTER — Other Ambulatory Visit: Payer: Self-pay | Admitting: Internal Medicine

## 2017-08-02 DIAGNOSIS — Z1231 Encounter for screening mammogram for malignant neoplasm of breast: Secondary | ICD-10-CM

## 2017-08-08 DIAGNOSIS — Z1211 Encounter for screening for malignant neoplasm of colon: Secondary | ICD-10-CM | POA: Diagnosis not present

## 2017-08-22 ENCOUNTER — Ambulatory Visit
Admission: RE | Admit: 2017-08-22 | Discharge: 2017-08-22 | Disposition: A | Discharge status: Home / Self Care | Payer: Commercial Managed Care - HMO | Source: Ambulatory Visit | Attending: Internal Medicine | Admitting: Internal Medicine

## 2017-08-22 DIAGNOSIS — Z1231 Encounter for screening mammogram for malignant neoplasm of breast: Secondary | ICD-10-CM | POA: Insufficient documentation

## 2017-08-22 HISTORY — DX: Malignant (primary) neoplasm, unspecified: C80.1

## 2017-10-19 DIAGNOSIS — I10 Essential (primary) hypertension: Secondary | ICD-10-CM | POA: Diagnosis not present

## 2017-10-19 DIAGNOSIS — E7800 Pure hypercholesterolemia, unspecified: Secondary | ICD-10-CM | POA: Diagnosis not present

## 2017-10-26 DIAGNOSIS — E7800 Pure hypercholesterolemia, unspecified: Secondary | ICD-10-CM | POA: Diagnosis not present

## 2017-10-26 DIAGNOSIS — E538 Deficiency of other specified B group vitamins: Secondary | ICD-10-CM | POA: Diagnosis not present

## 2017-10-26 DIAGNOSIS — E559 Vitamin D deficiency, unspecified: Secondary | ICD-10-CM | POA: Diagnosis not present

## 2017-10-26 DIAGNOSIS — I10 Essential (primary) hypertension: Secondary | ICD-10-CM | POA: Diagnosis not present

## 2017-10-26 DIAGNOSIS — N183 Chronic kidney disease, stage 3 (moderate): Secondary | ICD-10-CM | POA: Diagnosis not present

## 2017-10-26 DIAGNOSIS — J431 Panlobular emphysema: Secondary | ICD-10-CM | POA: Diagnosis not present

## 2017-10-26 DIAGNOSIS — Z79899 Other long term (current) drug therapy: Secondary | ICD-10-CM | POA: Diagnosis not present

## 2018-01-20 DIAGNOSIS — I10 Essential (primary) hypertension: Secondary | ICD-10-CM | POA: Diagnosis not present

## 2018-01-20 DIAGNOSIS — E7800 Pure hypercholesterolemia, unspecified: Secondary | ICD-10-CM | POA: Diagnosis not present

## 2018-01-20 DIAGNOSIS — Z79899 Other long term (current) drug therapy: Secondary | ICD-10-CM | POA: Diagnosis not present

## 2018-01-27 DIAGNOSIS — I10 Essential (primary) hypertension: Secondary | ICD-10-CM | POA: Diagnosis not present

## 2018-01-27 DIAGNOSIS — E538 Deficiency of other specified B group vitamins: Secondary | ICD-10-CM | POA: Diagnosis not present

## 2018-01-27 DIAGNOSIS — Z0000 Encounter for general adult medical examination without abnormal findings: Secondary | ICD-10-CM | POA: Diagnosis not present

## 2018-01-27 DIAGNOSIS — J449 Chronic obstructive pulmonary disease, unspecified: Secondary | ICD-10-CM | POA: Diagnosis not present

## 2018-01-27 DIAGNOSIS — Z8041 Family history of malignant neoplasm of ovary: Secondary | ICD-10-CM | POA: Diagnosis not present

## 2018-01-27 DIAGNOSIS — E7800 Pure hypercholesterolemia, unspecified: Secondary | ICD-10-CM | POA: Diagnosis not present

## 2018-01-27 DIAGNOSIS — L989 Disorder of the skin and subcutaneous tissue, unspecified: Secondary | ICD-10-CM | POA: Diagnosis not present

## 2018-01-27 DIAGNOSIS — E559 Vitamin D deficiency, unspecified: Secondary | ICD-10-CM | POA: Diagnosis not present

## 2018-01-27 DIAGNOSIS — J431 Panlobular emphysema: Secondary | ICD-10-CM | POA: Diagnosis not present

## 2018-02-16 DIAGNOSIS — S29012A Strain of muscle and tendon of back wall of thorax, initial encounter: Secondary | ICD-10-CM | POA: Diagnosis not present

## 2018-02-16 DIAGNOSIS — R0789 Other chest pain: Secondary | ICD-10-CM | POA: Diagnosis not present

## 2018-02-18 ENCOUNTER — Emergency Department
Admission: EM | Admit: 2018-02-18 | Discharge: 2018-02-18 | Disposition: A | Discharge status: Home / Self Care | Payer: Medicare HMO | Attending: Emergency Medicine | Admitting: Emergency Medicine

## 2018-02-18 ENCOUNTER — Other Ambulatory Visit: Payer: Self-pay

## 2018-02-18 ENCOUNTER — Encounter: Payer: Self-pay | Admitting: Emergency Medicine

## 2018-02-18 DIAGNOSIS — M5412 Radiculopathy, cervical region: Secondary | ICD-10-CM

## 2018-02-18 DIAGNOSIS — Z7902 Long term (current) use of antithrombotics/antiplatelets: Secondary | ICD-10-CM | POA: Insufficient documentation

## 2018-02-18 DIAGNOSIS — I10 Essential (primary) hypertension: Secondary | ICD-10-CM | POA: Diagnosis not present

## 2018-02-18 DIAGNOSIS — Z79899 Other long term (current) drug therapy: Secondary | ICD-10-CM | POA: Insufficient documentation

## 2018-02-18 DIAGNOSIS — M5030 Other cervical disc degeneration, unspecified cervical region: Secondary | ICD-10-CM

## 2018-02-18 DIAGNOSIS — Z7982 Long term (current) use of aspirin: Secondary | ICD-10-CM | POA: Diagnosis not present

## 2018-02-18 DIAGNOSIS — Z85828 Personal history of other malignant neoplasm of skin: Secondary | ICD-10-CM | POA: Diagnosis not present

## 2018-02-18 DIAGNOSIS — F17210 Nicotine dependence, cigarettes, uncomplicated: Secondary | ICD-10-CM | POA: Insufficient documentation

## 2018-02-18 DIAGNOSIS — M542 Cervicalgia: Secondary | ICD-10-CM | POA: Diagnosis not present

## 2018-02-18 MED ORDER — ONDANSETRON 4 MG PO TBDP
4.0000 mg | ORAL_TABLET | Freq: Once | ORAL | Status: AC
  Administered 2018-02-18: 4 mg via ORAL
  Filled 2018-02-18: qty 1

## 2018-02-18 MED ORDER — PREDNISONE 10 MG PO TABS: 10.0000 mg | ORAL_TABLET | Freq: Every day | ORAL | 0 refills | Status: DC

## 2018-02-18 MED ORDER — ONDANSETRON HCL 4 MG PO TABS: 4.0000 mg | ORAL_TABLET | Freq: Every day | ORAL | 0 refills | Status: AC | PRN

## 2018-02-18 MED ORDER — TRAMADOL HCL 50 MG PO TABS: 50.0000 mg | ORAL_TABLET | Freq: Four times a day (QID) | ORAL | 0 refills | Status: DC | PRN

## 2018-02-18 MED ORDER — DEXAMETHASONE SODIUM PHOSPHATE 10 MG/ML IJ SOLN
10.0000 mg | Freq: Once | INTRAMUSCULAR | Status: AC
  Administered 2018-02-18: 10 mg via INTRAMUSCULAR
  Filled 2018-02-18: qty 1

## 2018-02-18 NOTE — ED Provider Notes (Signed)
[facility] EMERGENCY DEPARTMENT Provider Note   CSN: [id number] Arrival date & time: 02/18/18  1906     History   Chief Complaint Chief Complaint  Patient presents with  . Neck Pain    HPI Katherine Mosley is a 77 y.o. female presents to the emergency department for evaluation of right-sided neck pain burning numbness and tingling radiating down her right triceps, ulnar aspect of the forearm and last 2 digits of the right hand.  Symptoms been present since Thursday of last week.  No trauma or injury.  CT scan of the cervical spine from 2014 shows diffuse degenerative disc disease with uncovertebral spurring with mild neuroforaminal narrowing.  She has a history of cervical radiculopathy affecting the left upper extremity in the past which responded well to conservative treatment with PT.  Patient's pain is moderate.  She has been taking Tylenol for pain.  Pain is increased with cervical extension, she gets relief with cervical flexion.  Was given muscle relaxers by walk-in clinic couple days ago which caused her to have nausea.  She denies any chest pain, shortness of breath.  No trauma or injury.  No weakness in the upper extremities.  HPI  Past Medical History:  Diagnosis Date  . 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 Hyperlipidemia   . Hypertension   . Seasonal allergies     There are no active problems to display for this patient.   Past Surgical History:  Procedure Laterality Date  . ABDOMINAL HYSTERECTOMY    . BRAIN SURGERY    . TONSILLECTOMY       OB History   None      Home Medications    Prior to Admission medications   Medication Sig Start Date End Date Taking? Authorizing Provider  acetaminophen (TYLENOL) 500 MG tablet Take 500 mg by mouth every 6 (six) hours as needed.    [provider]  alendronate (FOSAMAX) 70 MG tablet Take 70 mg by mouth once a week. Take with a full glass of water on an empty  stomach.    [provider]  aspirin 81 MG tablet Take 81 mg by mouth daily.    [provider]  calcium-vitamin D (OSCAL WITH D) 500-200 MG-UNIT tablet Take 1 tablet by mouth.    [provider]  guaiFENesin (MUCINEX) 600 MG 12 hr tablet Take 1 tablet (600 mg total) by mouth 2 (two) times daily as needed. 10/25/15   Andria Meuse, NP  loratadine (CLARITIN) 10 MG tablet Take 10 mg by mouth daily.    [provider]  losartan (COZAAR) 100 MG tablet Take 100 mg by mouth daily.    [provider]  metoprolol (LOPRESSOR) 50 MG tablet Take 50 mg by mouth once.    [provider]  mometasone (NASONEX) 50 MCG/ACT nasal spray Place 2 sprays into the nose 2 (two) times daily. 10/25/15   Andria Meuse, NP  ondansetron (ZOFRAN) 4 MG tablet Take 1 tablet (4 mg total) by mouth daily as needed for nausea or vomiting. 02/18/18 02/18/19  Duanne Guess, PA-C  pravastatin (PRAVACHOL) 40 MG tablet Take 40 mg by mouth daily.    [provider]  predniSONE (DELTASONE) 10 MG tablet Take 1 tablet (10 mg total) by mouth daily. 6,5,4,3,2,1 six day taper 02/18/18   Duanne Guess, PA-C  ranitidine (ZANTAC) 150 MG tablet Take 150 mg by mouth 2 (two)  times daily.    [provider]  traMADol (ULTRAM) 50 MG tablet Take 1 tablet (50 mg total) by mouth every 6 (six) hours as needed. 02/18/18   Duanne Guess, PA-C    Family History Family History  Problem Relation Age of Onset  . Breast cancer Neg Hx     Social History Social History   Tobacco Use  . Smoking status: Current Every Day Smoker    Packs/day: 0.50  . Smokeless tobacco: Never Used  Substance Use Topics  . Alcohol use: No  . Drug use: No     Allergies   Prednisone and Sulfur   Review of Systems Review of Systems  Constitutional: Negative for activity change.  Eyes: Negative for pain and visual disturbance.  Respiratory: Negative for shortness of breath.     Cardiovascular: Negative for chest pain and leg swelling.  Gastrointestinal: Negative for abdominal pain.  Genitourinary: Negative for flank pain and pelvic pain.  Musculoskeletal: Positive for neck pain. Negative for arthralgias, gait problem, joint swelling, myalgias and neck stiffness.  Skin: Negative for wound.  Neurological: Positive for numbness. Negative for dizziness, syncope, weakness, light-headedness and headaches.  Psychiatric/Behavioral: Negative for confusion and decreased concentration.     Physical Exam Updated Vital Signs BP (!) 123/58 (BP Location: Left Arm)   Pulse 89   Temp 97.8 F (36.6 C) (Oral)   Resp 18   Ht 5\' 1"  (1.549 m)   Wt 68 kg (150 lb)   SpO2 96%   BMI 28.34 kg/m   Physical Exam  Constitutional: She is oriented to person, place, and time. She appears well-developed and well-nourished.  HENT:  Head: Normocephalic and atraumatic.  Eyes: Conjunctivae are normal.  Neck: Normal range of motion.  Cardiovascular: Normal rate.  Pulmonary/Chest: Effort normal. No respiratory distress.  Musculoskeletal:  Cervical Spine: Examination of the cervical spine reveals no bony abnormality, no edema, and no ecchymosis.  There is no step-off.  The patient has full active and passive range of motion of the cervical spine with flexion, extension, and right and left bend with rotation.  There is no crepitus with range of motion exercises.  The patient is non-tender along the spinous process to palpation.  The patient has no paravertebral muscle spasm.  There is no parascapular discomfort.  The patient has a negative axial compression test.  The patient has a positive Spurling's test.   Right and left upper Extremity: Examination of the right and left shoulder and arm showed no bony abnormality or edema.  The patient has normal active and passive motion with abduction, flexion, internal rotation, and external rotation.  The patient has no tenderness with motion.  The  patient has a negative Hawkins test and a negative impingement test.  The patient has a negative drop arm test.  The patient is non-tender along the deltoid muscle.  There is no subacromial space tenderness with no AC joint tenderness.  The patient has no instability of the shoulder with anterior-posterior motion.  There is a negative sulcus sign.  The rotator cuff muscle strength is 5/5 with supraspinatus, 5/5 with internal rotation, and 5/5 with external rotation.  There is no crepitus with range of motion activities.     Neurological: She is alert and oriented to person, place, and time.  Skin: Skin is warm. No rash noted.  Psychiatric: She has a normal mood and affect. Her behavior is normal. Thought content normal.     ED Treatments / Results  Labs (  all labs ordered are listed, but only abnormal results are displayed) Labs Reviewed - No data to display  EKG None  Radiology No results found.  Procedures Procedures (including critical care time)  Medications Ordered in ED Medications  ondansetron (ZOFRAN-ODT) disintegrating tablet 4 mg (4 mg Oral Given 02/18/18 2125)  dexamethasone (DECADRON) injection 10 mg (10 mg Intramuscular Given 02/18/18 2125)     Initial Impression / Assessment and Plan / ED Course  I have reviewed the triage vital signs and the nursing notes.  Pertinent labs & imaging results that were available during my care of the patient were reviewed by me and considered in my medical decision making (see chart for details).     77 year old female with right sided cervical radiculopathy.  Symptoms increased with cervical extension but she gets relief with cervical flexion.  No weakness noted.  No trauma or injury.  No spinous process tenderness.  She has a history of cervical degenerative disc disease and uncovertebral spurring.  Slight elevation in creatinine so we will avoid NSAIDs.  She is given a 6-day Medrol Dosepak.  She is given tramadol as needed for more  severe pain.  She will follow-up with orthopedics if no improvement 1 week.  Final Clinical Impressions(s) / ED Diagnoses   Final diagnoses:  Right cervical radiculopathy  DDD (degenerative disc disease), cervical    ED Discharge Orders        Ordered    ondansetron (ZOFRAN) 4 MG tablet  Daily PRN     02/18/18 2221    predniSONE (DELTASONE) 10 MG tablet  Daily     02/18/18 2221    traMADol (ULTRAM) 50 MG tablet  Every 6 hours PRN     02/18/18 2221       Duanne Guess, PA-C 02/18/18 [place], [place], MD 02/19/18 [phone]

## 2018-02-18 NOTE — ED Triage Notes (Signed)
Pt arrives ambulatory to triage with c/o neck and upper back pain since Wednesday. Pt was seen at the walk kin clinic on Thursday where she was diagnosed with muscle strain. Pt denies injury or trauma at this time and is in NAD.

## 2018-02-18 NOTE — Discharge Instructions (Addendum)
Please take prednisone as prescribed.  Use Zofran as needed for nausea.  Use tramadol as needed for moderate to severe pain.  Follow-up with orthopedics or neurosurgery in 1 week if no improvement.  Return to the ER for any worsening symptoms or no changes in health.

## 2018-02-21 DIAGNOSIS — I10 Essential (primary) hypertension: Secondary | ICD-10-CM | POA: Diagnosis not present

## 2018-02-21 DIAGNOSIS — M5030 Other cervical disc degeneration, unspecified cervical region: Secondary | ICD-10-CM | POA: Diagnosis not present

## 2018-02-21 DIAGNOSIS — J431 Panlobular emphysema: Secondary | ICD-10-CM | POA: Diagnosis not present

## 2018-02-21 DIAGNOSIS — M542 Cervicalgia: Secondary | ICD-10-CM | POA: Diagnosis not present

## 2018-02-24 ENCOUNTER — Other Ambulatory Visit: Payer: Self-pay | Admitting: Physical Medicine and Rehabilitation

## 2018-02-24 DIAGNOSIS — M5030 Other cervical disc degeneration, unspecified cervical region: Secondary | ICD-10-CM | POA: Diagnosis not present

## 2018-02-24 DIAGNOSIS — M5412 Radiculopathy, cervical region: Secondary | ICD-10-CM

## 2018-02-28 DIAGNOSIS — M5412 Radiculopathy, cervical region: Secondary | ICD-10-CM | POA: Diagnosis not present

## 2018-03-01 ENCOUNTER — Ambulatory Visit
Admission: RE | Admit: 2018-03-01 | Discharge: 2018-03-01 | Disposition: A | Discharge status: Home / Self Care | Payer: Medicare HMO | Source: Ambulatory Visit | Attending: Physical Medicine and Rehabilitation | Admitting: Physical Medicine and Rehabilitation

## 2018-03-01 DIAGNOSIS — M5412 Radiculopathy, cervical region: Secondary | ICD-10-CM

## 2018-03-01 DIAGNOSIS — M4722 Other spondylosis with radiculopathy, cervical region: Secondary | ICD-10-CM | POA: Diagnosis not present

## 2018-03-01 DIAGNOSIS — M50123 Cervical disc disorder at C6-C7 level with radiculopathy: Secondary | ICD-10-CM | POA: Diagnosis not present

## 2018-03-03 ENCOUNTER — Ambulatory Visit: Payer: Medicare HMO

## 2018-03-31 DIAGNOSIS — M5030 Other cervical disc degeneration, unspecified cervical region: Secondary | ICD-10-CM | POA: Diagnosis not present

## 2018-03-31 DIAGNOSIS — M5412 Radiculopathy, cervical region: Secondary | ICD-10-CM | POA: Diagnosis not present

## 2018-04-28 DIAGNOSIS — M5412 Radiculopathy, cervical region: Secondary | ICD-10-CM | POA: Diagnosis not present

## 2018-04-28 DIAGNOSIS — J431 Panlobular emphysema: Secondary | ICD-10-CM | POA: Diagnosis not present

## 2018-04-28 DIAGNOSIS — M5030 Other cervical disc degeneration, unspecified cervical region: Secondary | ICD-10-CM | POA: Diagnosis not present

## 2018-04-28 DIAGNOSIS — R29898 Other symptoms and signs involving the musculoskeletal system: Secondary | ICD-10-CM | POA: Diagnosis not present

## 2018-04-28 DIAGNOSIS — E7800 Pure hypercholesterolemia, unspecified: Secondary | ICD-10-CM | POA: Diagnosis not present

## 2018-04-28 DIAGNOSIS — Z79899 Other long term (current) drug therapy: Secondary | ICD-10-CM | POA: Diagnosis not present

## 2018-04-28 DIAGNOSIS — I10 Essential (primary) hypertension: Secondary | ICD-10-CM | POA: Diagnosis not present

## 2018-05-01 ENCOUNTER — Other Ambulatory Visit: Payer: Self-pay | Admitting: Internal Medicine

## 2018-05-01 DIAGNOSIS — R29898 Other symptoms and signs involving the musculoskeletal system: Secondary | ICD-10-CM

## 2018-05-08 ENCOUNTER — Ambulatory Visit
Admission: RE | Admit: 2018-05-08 | Discharge: 2018-05-08 | Disposition: A | Discharge status: Home / Self Care | Payer: Medicare HMO | Source: Ambulatory Visit | Attending: Internal Medicine | Admitting: Internal Medicine

## 2018-05-08 DIAGNOSIS — R29898 Other symptoms and signs involving the musculoskeletal system: Secondary | ICD-10-CM | POA: Diagnosis not present

## 2018-05-08 DIAGNOSIS — G9389 Other specified disorders of brain: Secondary | ICD-10-CM | POA: Insufficient documentation

## 2018-05-08 DIAGNOSIS — Z9889 Other specified postprocedural states: Secondary | ICD-10-CM | POA: Insufficient documentation

## 2018-05-08 DIAGNOSIS — R200 Anesthesia of skin: Secondary | ICD-10-CM | POA: Diagnosis not present

## 2018-05-26 DIAGNOSIS — M5412 Radiculopathy, cervical region: Secondary | ICD-10-CM | POA: Diagnosis not present

## 2018-05-26 DIAGNOSIS — M5030 Other cervical disc degeneration, unspecified cervical region: Secondary | ICD-10-CM | POA: Diagnosis not present

## 2018-06-27 DIAGNOSIS — Z961 Presence of intraocular lens: Secondary | ICD-10-CM | POA: Diagnosis not present

## 2018-06-27 DIAGNOSIS — H524 Presbyopia: Secondary | ICD-10-CM | POA: Diagnosis not present

## 2018-07-25 DIAGNOSIS — H2512 Age-related nuclear cataract, left eye: Secondary | ICD-10-CM | POA: Diagnosis not present

## 2018-08-01 DIAGNOSIS — H2511 Age-related nuclear cataract, right eye: Secondary | ICD-10-CM | POA: Diagnosis not present

## 2018-08-10 ENCOUNTER — Other Ambulatory Visit: Payer: Self-pay | Admitting: Internal Medicine

## 2018-08-10 DIAGNOSIS — Z1231 Encounter for screening mammogram for malignant neoplasm of breast: Secondary | ICD-10-CM

## 2018-08-24 ENCOUNTER — Ambulatory Visit
Admission: RE | Admit: 2018-08-24 | Discharge: 2018-08-24 | Disposition: A | Discharge status: Home / Self Care | Payer: Medicare HMO | Source: Ambulatory Visit | Attending: Internal Medicine | Admitting: Internal Medicine

## 2018-08-24 DIAGNOSIS — Z1231 Encounter for screening mammogram for malignant neoplasm of breast: Secondary | ICD-10-CM | POA: Insufficient documentation

## 2018-09-06 DIAGNOSIS — E7800 Pure hypercholesterolemia, unspecified: Secondary | ICD-10-CM | POA: Diagnosis not present

## 2018-09-06 DIAGNOSIS — I10 Essential (primary) hypertension: Secondary | ICD-10-CM | POA: Diagnosis not present

## 2018-09-06 DIAGNOSIS — Z79899 Other long term (current) drug therapy: Secondary | ICD-10-CM | POA: Diagnosis not present

## 2018-09-13 DIAGNOSIS — Z0000 Encounter for general adult medical examination without abnormal findings: Secondary | ICD-10-CM | POA: Diagnosis not present

## 2018-09-13 DIAGNOSIS — E559 Vitamin D deficiency, unspecified: Secondary | ICD-10-CM | POA: Diagnosis not present

## 2018-09-13 DIAGNOSIS — R29898 Other symptoms and signs involving the musculoskeletal system: Secondary | ICD-10-CM | POA: Diagnosis not present

## 2018-09-13 DIAGNOSIS — J449 Chronic obstructive pulmonary disease, unspecified: Secondary | ICD-10-CM | POA: Diagnosis not present

## 2018-09-13 DIAGNOSIS — J431 Panlobular emphysema: Secondary | ICD-10-CM | POA: Diagnosis not present

## 2018-09-13 DIAGNOSIS — Z1211 Encounter for screening for malignant neoplasm of colon: Secondary | ICD-10-CM | POA: Diagnosis not present

## 2018-09-13 DIAGNOSIS — E7800 Pure hypercholesterolemia, unspecified: Secondary | ICD-10-CM | POA: Diagnosis not present

## 2018-09-13 DIAGNOSIS — I10 Essential (primary) hypertension: Secondary | ICD-10-CM | POA: Diagnosis not present

## 2018-09-20 DIAGNOSIS — Z1211 Encounter for screening for malignant neoplasm of colon: Secondary | ICD-10-CM | POA: Diagnosis not present

## 2018-09-26 DIAGNOSIS — I10 Essential (primary) hypertension: Secondary | ICD-10-CM | POA: Diagnosis not present

## 2018-10-11 DIAGNOSIS — E7800 Pure hypercholesterolemia, unspecified: Secondary | ICD-10-CM | POA: Diagnosis not present

## 2018-10-11 DIAGNOSIS — I10 Essential (primary) hypertension: Secondary | ICD-10-CM | POA: Diagnosis not present

## 2018-12-05 ENCOUNTER — Other Ambulatory Visit: Payer: Self-pay | Admitting: Physical Medicine and Rehabilitation

## 2018-12-05 DIAGNOSIS — M5442 Lumbago with sciatica, left side: Principal | ICD-10-CM

## 2018-12-05 DIAGNOSIS — G8929 Other chronic pain: Secondary | ICD-10-CM

## 2018-12-12 ENCOUNTER — Ambulatory Visit
Admission: RE | Admit: 2018-12-12 | Discharge: 2018-12-12 | Disposition: A | Discharge status: Home / Self Care | Payer: Medicare HMO | Source: Ambulatory Visit | Attending: Physical Medicine and Rehabilitation | Admitting: Physical Medicine and Rehabilitation

## 2018-12-12 DIAGNOSIS — M5442 Lumbago with sciatica, left side: Secondary | ICD-10-CM | POA: Insufficient documentation

## 2018-12-12 DIAGNOSIS — G8929 Other chronic pain: Secondary | ICD-10-CM | POA: Diagnosis present

## 2019-04-26 DIAGNOSIS — M47816 Spondylosis without myelopathy or radiculopathy, lumbar region: Secondary | ICD-10-CM | POA: Diagnosis not present

## 2019-05-23 DIAGNOSIS — M48062 Spinal stenosis, lumbar region with neurogenic claudication: Secondary | ICD-10-CM | POA: Diagnosis not present

## 2019-05-23 DIAGNOSIS — M5412 Radiculopathy, cervical region: Secondary | ICD-10-CM | POA: Diagnosis not present

## 2019-05-23 DIAGNOSIS — M5030 Other cervical disc degeneration, unspecified cervical region: Secondary | ICD-10-CM | POA: Diagnosis not present

## 2019-05-23 DIAGNOSIS — M5416 Radiculopathy, lumbar region: Secondary | ICD-10-CM | POA: Diagnosis not present

## 2019-05-23 DIAGNOSIS — M5136 Other intervertebral disc degeneration, lumbar region: Secondary | ICD-10-CM | POA: Diagnosis not present

## 2019-05-23 DIAGNOSIS — M25562 Pain in left knee: Secondary | ICD-10-CM | POA: Diagnosis not present

## 2019-05-23 DIAGNOSIS — M4726 Other spondylosis with radiculopathy, lumbar region: Secondary | ICD-10-CM | POA: Diagnosis not present

## 2019-05-31 DIAGNOSIS — M25561 Pain in right knee: Secondary | ICD-10-CM | POA: Insufficient documentation

## 2019-05-31 DIAGNOSIS — I10 Essential (primary) hypertension: Secondary | ICD-10-CM | POA: Diagnosis not present

## 2019-05-31 DIAGNOSIS — E7800 Pure hypercholesterolemia, unspecified: Secondary | ICD-10-CM | POA: Diagnosis not present

## 2019-06-01 DIAGNOSIS — M47812 Spondylosis without myelopathy or radiculopathy, cervical region: Secondary | ICD-10-CM | POA: Diagnosis not present

## 2019-06-01 DIAGNOSIS — E785 Hyperlipidemia, unspecified: Secondary | ICD-10-CM | POA: Diagnosis not present

## 2019-06-01 DIAGNOSIS — M5136 Other intervertebral disc degeneration, lumbar region: Secondary | ICD-10-CM | POA: Diagnosis not present

## 2019-06-01 DIAGNOSIS — F329 Major depressive disorder, single episode, unspecified: Secondary | ICD-10-CM | POA: Diagnosis not present

## 2019-06-01 DIAGNOSIS — M48062 Spinal stenosis, lumbar region with neurogenic claudication: Secondary | ICD-10-CM | POA: Diagnosis not present

## 2019-06-01 DIAGNOSIS — I10 Essential (primary) hypertension: Secondary | ICD-10-CM | POA: Diagnosis not present

## 2019-06-01 DIAGNOSIS — M4726 Other spondylosis with radiculopathy, lumbar region: Secondary | ICD-10-CM | POA: Diagnosis not present

## 2019-06-01 DIAGNOSIS — J449 Chronic obstructive pulmonary disease, unspecified: Secondary | ICD-10-CM | POA: Diagnosis not present

## 2019-06-01 DIAGNOSIS — M5416 Radiculopathy, lumbar region: Secondary | ICD-10-CM | POA: Diagnosis not present

## 2019-06-11 DIAGNOSIS — I788 Other diseases of capillaries: Secondary | ICD-10-CM | POA: Diagnosis not present

## 2019-06-11 DIAGNOSIS — Z85828 Personal history of other malignant neoplasm of skin: Secondary | ICD-10-CM | POA: Diagnosis not present

## 2019-06-11 DIAGNOSIS — L820 Inflamed seborrheic keratosis: Secondary | ICD-10-CM | POA: Diagnosis not present

## 2019-06-11 DIAGNOSIS — L821 Other seborrheic keratosis: Secondary | ICD-10-CM | POA: Diagnosis not present

## 2019-06-11 DIAGNOSIS — D1800 Hemangioma unspecified site: Secondary | ICD-10-CM | POA: Diagnosis not present

## 2019-06-11 DIAGNOSIS — D692 Other nonthrombocytopenic purpura: Secondary | ICD-10-CM | POA: Diagnosis not present

## 2019-06-11 DIAGNOSIS — Z1283 Encounter for screening for malignant neoplasm of skin: Secondary | ICD-10-CM | POA: Diagnosis not present

## 2019-06-11 DIAGNOSIS — L812 Freckles: Secondary | ICD-10-CM | POA: Diagnosis not present

## 2019-06-13 DIAGNOSIS — M48062 Spinal stenosis, lumbar region with neurogenic claudication: Secondary | ICD-10-CM | POA: Diagnosis not present

## 2019-06-13 DIAGNOSIS — M4726 Other spondylosis with radiculopathy, lumbar region: Secondary | ICD-10-CM | POA: Diagnosis not present

## 2019-06-13 DIAGNOSIS — M5136 Other intervertebral disc degeneration, lumbar region: Secondary | ICD-10-CM | POA: Diagnosis not present

## 2019-06-13 DIAGNOSIS — M25562 Pain in left knee: Secondary | ICD-10-CM | POA: Diagnosis not present

## 2019-06-13 DIAGNOSIS — M1712 Unilateral primary osteoarthritis, left knee: Secondary | ICD-10-CM | POA: Diagnosis not present

## 2019-06-13 DIAGNOSIS — M5412 Radiculopathy, cervical region: Secondary | ICD-10-CM | POA: Diagnosis not present

## 2019-06-13 DIAGNOSIS — M5416 Radiculopathy, lumbar region: Secondary | ICD-10-CM | POA: Diagnosis not present

## 2019-06-13 DIAGNOSIS — M5030 Other cervical disc degeneration, unspecified cervical region: Secondary | ICD-10-CM | POA: Diagnosis not present

## 2019-06-15 DIAGNOSIS — E7800 Pure hypercholesterolemia, unspecified: Secondary | ICD-10-CM | POA: Diagnosis not present

## 2019-06-15 DIAGNOSIS — I10 Essential (primary) hypertension: Secondary | ICD-10-CM | POA: Diagnosis not present

## 2019-06-15 DIAGNOSIS — Z79899 Other long term (current) drug therapy: Secondary | ICD-10-CM | POA: Diagnosis not present

## 2019-06-15 DIAGNOSIS — E538 Deficiency of other specified B group vitamins: Secondary | ICD-10-CM | POA: Diagnosis not present

## 2019-06-15 DIAGNOSIS — E559 Vitamin D deficiency, unspecified: Secondary | ICD-10-CM | POA: Diagnosis not present

## 2019-08-31 ENCOUNTER — Other Ambulatory Visit: Payer: Self-pay | Admitting: Internal Medicine

## 2019-08-31 DIAGNOSIS — Z1231 Encounter for screening mammogram for malignant neoplasm of breast: Secondary | ICD-10-CM

## 2019-09-10 DIAGNOSIS — I10 Essential (primary) hypertension: Secondary | ICD-10-CM | POA: Diagnosis not present

## 2019-09-10 DIAGNOSIS — E7800 Pure hypercholesterolemia, unspecified: Secondary | ICD-10-CM | POA: Diagnosis not present

## 2019-09-10 DIAGNOSIS — Z79899 Other long term (current) drug therapy: Secondary | ICD-10-CM | POA: Diagnosis not present

## 2019-09-12 DIAGNOSIS — M1712 Unilateral primary osteoarthritis, left knee: Secondary | ICD-10-CM | POA: Diagnosis not present

## 2019-09-12 DIAGNOSIS — M5136 Other intervertebral disc degeneration, lumbar region: Secondary | ICD-10-CM | POA: Diagnosis not present

## 2019-09-12 DIAGNOSIS — M48062 Spinal stenosis, lumbar region with neurogenic claudication: Secondary | ICD-10-CM | POA: Diagnosis not present

## 2019-09-12 DIAGNOSIS — M5030 Other cervical disc degeneration, unspecified cervical region: Secondary | ICD-10-CM | POA: Diagnosis not present

## 2019-09-12 DIAGNOSIS — M5416 Radiculopathy, lumbar region: Secondary | ICD-10-CM | POA: Diagnosis not present

## 2019-09-12 DIAGNOSIS — M5412 Radiculopathy, cervical region: Secondary | ICD-10-CM | POA: Diagnosis not present

## 2019-09-17 DIAGNOSIS — N189 Chronic kidney disease, unspecified: Secondary | ICD-10-CM | POA: Diagnosis not present

## 2019-09-17 DIAGNOSIS — I129 Hypertensive chronic kidney disease with stage 1 through stage 4 chronic kidney disease, or unspecified chronic kidney disease: Secondary | ICD-10-CM | POA: Diagnosis not present

## 2019-09-17 DIAGNOSIS — Z0000 Encounter for general adult medical examination without abnormal findings: Secondary | ICD-10-CM | POA: Diagnosis not present

## 2019-09-17 DIAGNOSIS — J449 Chronic obstructive pulmonary disease, unspecified: Secondary | ICD-10-CM | POA: Diagnosis not present

## 2019-09-17 DIAGNOSIS — E785 Hyperlipidemia, unspecified: Secondary | ICD-10-CM | POA: Diagnosis not present

## 2019-09-17 DIAGNOSIS — J431 Panlobular emphysema: Secondary | ICD-10-CM | POA: Diagnosis not present

## 2019-09-17 DIAGNOSIS — Z87891 Personal history of nicotine dependence: Secondary | ICD-10-CM | POA: Diagnosis not present

## 2019-09-18 ENCOUNTER — Other Ambulatory Visit: Payer: Self-pay | Admitting: Internal Medicine

## 2019-09-18 DIAGNOSIS — N1832 Chronic kidney disease, stage 3b: Secondary | ICD-10-CM

## 2019-09-21 DIAGNOSIS — M25562 Pain in left knee: Secondary | ICD-10-CM | POA: Diagnosis not present

## 2019-09-21 DIAGNOSIS — G8929 Other chronic pain: Secondary | ICD-10-CM | POA: Diagnosis not present

## 2019-09-21 DIAGNOSIS — M1712 Unilateral primary osteoarthritis, left knee: Secondary | ICD-10-CM | POA: Diagnosis not present

## 2019-09-21 DIAGNOSIS — M1120 Other chondrocalcinosis, unspecified site: Secondary | ICD-10-CM | POA: Diagnosis not present

## 2019-09-26 ENCOUNTER — Other Ambulatory Visit: Payer: Self-pay

## 2019-09-26 ENCOUNTER — Ambulatory Visit
Admission: RE | Admit: 2019-09-26 | Discharge: 2019-09-26 | Disposition: A | Discharge status: Home / Self Care | Payer: Medicare HMO | Source: Ambulatory Visit | Attending: Internal Medicine | Admitting: Internal Medicine

## 2019-09-26 DIAGNOSIS — N1832 Chronic kidney disease, stage 3b: Secondary | ICD-10-CM | POA: Insufficient documentation

## 2019-09-26 DIAGNOSIS — N189 Chronic kidney disease, unspecified: Secondary | ICD-10-CM | POA: Diagnosis not present

## 2019-09-28 DIAGNOSIS — M1712 Unilateral primary osteoarthritis, left knee: Secondary | ICD-10-CM | POA: Diagnosis not present

## 2019-09-28 DIAGNOSIS — M25562 Pain in left knee: Secondary | ICD-10-CM | POA: Diagnosis not present

## 2019-09-28 DIAGNOSIS — G8929 Other chronic pain: Secondary | ICD-10-CM | POA: Diagnosis not present

## 2019-09-28 DIAGNOSIS — M1120 Other chondrocalcinosis, unspecified site: Secondary | ICD-10-CM | POA: Diagnosis not present

## 2019-10-05 DIAGNOSIS — M25562 Pain in left knee: Secondary | ICD-10-CM | POA: Diagnosis not present

## 2019-10-05 DIAGNOSIS — G8929 Other chronic pain: Secondary | ICD-10-CM | POA: Diagnosis not present

## 2019-10-05 DIAGNOSIS — M1712 Unilateral primary osteoarthritis, left knee: Secondary | ICD-10-CM | POA: Diagnosis not present

## 2019-10-05 DIAGNOSIS — M1120 Other chondrocalcinosis, unspecified site: Secondary | ICD-10-CM | POA: Diagnosis not present

## 2019-10-12 DIAGNOSIS — G8929 Other chronic pain: Secondary | ICD-10-CM | POA: Diagnosis not present

## 2019-10-12 DIAGNOSIS — M25562 Pain in left knee: Secondary | ICD-10-CM | POA: Diagnosis not present

## 2019-10-12 DIAGNOSIS — M1712 Unilateral primary osteoarthritis, left knee: Secondary | ICD-10-CM | POA: Diagnosis not present

## 2019-10-12 DIAGNOSIS — M1120 Other chondrocalcinosis, unspecified site: Secondary | ICD-10-CM | POA: Diagnosis not present

## 2019-11-27 ENCOUNTER — Inpatient Hospital Stay
Admission: RE | Admit: 2019-11-27 | Discharge: 2019-11-27 | Disposition: A | Discharge status: Home / Self Care | Payer: Medicare HMO | Source: Ambulatory Visit | Attending: Internal Medicine | Admitting: Internal Medicine

## 2019-11-28 ENCOUNTER — Ambulatory Visit
Admission: RE | Admit: 2019-11-28 | Discharge: 2019-11-28 | Disposition: A | Discharge status: Home / Self Care | Payer: Medicare HMO | Source: Ambulatory Visit | Attending: Internal Medicine | Admitting: Internal Medicine

## 2019-11-28 DIAGNOSIS — I10 Essential (primary) hypertension: Secondary | ICD-10-CM | POA: Diagnosis not present

## 2019-11-28 DIAGNOSIS — Z1231 Encounter for screening mammogram for malignant neoplasm of breast: Secondary | ICD-10-CM | POA: Diagnosis not present

## 2019-11-28 DIAGNOSIS — E538 Deficiency of other specified B group vitamins: Secondary | ICD-10-CM | POA: Diagnosis not present

## 2019-11-28 DIAGNOSIS — E559 Vitamin D deficiency, unspecified: Secondary | ICD-10-CM | POA: Diagnosis not present

## 2019-11-28 DIAGNOSIS — Z79899 Other long term (current) drug therapy: Secondary | ICD-10-CM | POA: Diagnosis not present

## 2019-12-03 DIAGNOSIS — N183 Chronic kidney disease, stage 3 unspecified: Secondary | ICD-10-CM | POA: Insufficient documentation

## 2019-12-03 DIAGNOSIS — I10 Essential (primary) hypertension: Secondary | ICD-10-CM | POA: Diagnosis not present

## 2019-12-03 DIAGNOSIS — N1832 Chronic kidney disease, stage 3b: Secondary | ICD-10-CM | POA: Diagnosis not present

## 2019-12-03 DIAGNOSIS — E7800 Pure hypercholesterolemia, unspecified: Secondary | ICD-10-CM | POA: Diagnosis not present

## 2019-12-05 DIAGNOSIS — E785 Hyperlipidemia, unspecified: Secondary | ICD-10-CM | POA: Diagnosis not present

## 2019-12-05 DIAGNOSIS — Z87891 Personal history of nicotine dependence: Secondary | ICD-10-CM | POA: Diagnosis not present

## 2019-12-05 DIAGNOSIS — E538 Deficiency of other specified B group vitamins: Secondary | ICD-10-CM | POA: Diagnosis not present

## 2019-12-05 DIAGNOSIS — J449 Chronic obstructive pulmonary disease, unspecified: Secondary | ICD-10-CM | POA: Diagnosis not present

## 2019-12-05 DIAGNOSIS — I10 Essential (primary) hypertension: Secondary | ICD-10-CM | POA: Diagnosis not present

## 2019-12-05 DIAGNOSIS — M25511 Pain in right shoulder: Secondary | ICD-10-CM | POA: Diagnosis not present

## 2019-12-05 DIAGNOSIS — Z0000 Encounter for general adult medical examination without abnormal findings: Secondary | ICD-10-CM | POA: Diagnosis not present

## 2019-12-05 DIAGNOSIS — E559 Vitamin D deficiency, unspecified: Secondary | ICD-10-CM | POA: Diagnosis not present

## 2019-12-11 DIAGNOSIS — L820 Inflamed seborrheic keratosis: Secondary | ICD-10-CM | POA: Diagnosis not present

## 2019-12-11 DIAGNOSIS — L821 Other seborrheic keratosis: Secondary | ICD-10-CM | POA: Diagnosis not present

## 2019-12-11 DIAGNOSIS — L720 Epidermal cyst: Secondary | ICD-10-CM | POA: Diagnosis not present

## 2019-12-11 DIAGNOSIS — D1801 Hemangioma of skin and subcutaneous tissue: Secondary | ICD-10-CM | POA: Diagnosis not present

## 2019-12-11 DIAGNOSIS — Z87828 Personal history of other (healed) physical injury and trauma: Secondary | ICD-10-CM | POA: Diagnosis not present

## 2019-12-17 DIAGNOSIS — M4722 Other spondylosis with radiculopathy, cervical region: Secondary | ICD-10-CM | POA: Insufficient documentation

## 2019-12-17 DIAGNOSIS — M542 Cervicalgia: Secondary | ICD-10-CM | POA: Diagnosis not present

## 2019-12-17 DIAGNOSIS — M25511 Pain in right shoulder: Secondary | ICD-10-CM | POA: Diagnosis not present

## 2019-12-25 DIAGNOSIS — Z1211 Encounter for screening for malignant neoplasm of colon: Secondary | ICD-10-CM | POA: Diagnosis not present

## 2020-01-03 DIAGNOSIS — R195 Other fecal abnormalities: Secondary | ICD-10-CM | POA: Diagnosis not present

## 2020-01-14 DIAGNOSIS — M25561 Pain in right knee: Secondary | ICD-10-CM | POA: Diagnosis not present

## 2020-01-14 DIAGNOSIS — M1120 Other chondrocalcinosis, unspecified site: Secondary | ICD-10-CM | POA: Diagnosis not present

## 2020-01-14 DIAGNOSIS — G8929 Other chronic pain: Secondary | ICD-10-CM | POA: Diagnosis not present

## 2020-01-14 DIAGNOSIS — M170 Bilateral primary osteoarthritis of knee: Secondary | ICD-10-CM | POA: Diagnosis not present

## 2020-01-14 DIAGNOSIS — M25562 Pain in left knee: Secondary | ICD-10-CM | POA: Diagnosis not present

## 2020-01-22 DIAGNOSIS — M5030 Other cervical disc degeneration, unspecified cervical region: Secondary | ICD-10-CM | POA: Diagnosis not present

## 2020-01-22 DIAGNOSIS — M5412 Radiculopathy, cervical region: Secondary | ICD-10-CM | POA: Diagnosis not present

## 2020-02-26 DIAGNOSIS — Z79899 Other long term (current) drug therapy: Secondary | ICD-10-CM | POA: Diagnosis not present

## 2020-02-26 DIAGNOSIS — E7800 Pure hypercholesterolemia, unspecified: Secondary | ICD-10-CM | POA: Diagnosis not present

## 2020-03-03 DIAGNOSIS — M5030 Other cervical disc degeneration, unspecified cervical region: Secondary | ICD-10-CM | POA: Diagnosis not present

## 2020-03-03 DIAGNOSIS — M5412 Radiculopathy, cervical region: Secondary | ICD-10-CM | POA: Diagnosis not present

## 2020-03-04 ENCOUNTER — Other Ambulatory Visit: Payer: Self-pay | Admitting: Internal Medicine

## 2020-03-04 DIAGNOSIS — I129 Hypertensive chronic kidney disease with stage 1 through stage 4 chronic kidney disease, or unspecified chronic kidney disease: Secondary | ICD-10-CM | POA: Diagnosis not present

## 2020-03-04 DIAGNOSIS — M7989 Other specified soft tissue disorders: Secondary | ICD-10-CM

## 2020-03-04 DIAGNOSIS — Z87891 Personal history of nicotine dependence: Secondary | ICD-10-CM | POA: Diagnosis not present

## 2020-03-04 DIAGNOSIS — E7800 Pure hypercholesterolemia, unspecified: Secondary | ICD-10-CM | POA: Diagnosis not present

## 2020-03-04 DIAGNOSIS — N1832 Chronic kidney disease, stage 3b: Secondary | ICD-10-CM | POA: Diagnosis not present

## 2020-03-04 DIAGNOSIS — Z79899 Other long term (current) drug therapy: Secondary | ICD-10-CM | POA: Diagnosis not present

## 2020-03-04 DIAGNOSIS — M4722 Other spondylosis with radiculopathy, cervical region: Secondary | ICD-10-CM | POA: Diagnosis not present

## 2020-03-06 ENCOUNTER — Ambulatory Visit
Admission: RE | Admit: 2020-03-06 | Discharge: 2020-03-06 | Disposition: A | Discharge status: Home / Self Care | Payer: Medicare HMO | Source: Ambulatory Visit | Attending: Internal Medicine | Admitting: Internal Medicine

## 2020-03-06 ENCOUNTER — Other Ambulatory Visit: Payer: Self-pay

## 2020-03-06 DIAGNOSIS — M7989 Other specified soft tissue disorders: Secondary | ICD-10-CM | POA: Insufficient documentation

## 2020-03-06 DIAGNOSIS — R600 Localized edema: Secondary | ICD-10-CM | POA: Diagnosis not present

## 2020-03-20 DIAGNOSIS — M25561 Pain in right knee: Secondary | ICD-10-CM | POA: Diagnosis not present

## 2020-03-20 DIAGNOSIS — M1711 Unilateral primary osteoarthritis, right knee: Secondary | ICD-10-CM | POA: Diagnosis not present

## 2020-03-20 DIAGNOSIS — M25562 Pain in left knee: Secondary | ICD-10-CM | POA: Diagnosis not present

## 2020-03-20 DIAGNOSIS — M1120 Other chondrocalcinosis, unspecified site: Secondary | ICD-10-CM | POA: Diagnosis not present

## 2020-03-20 DIAGNOSIS — M7122 Synovial cyst of popliteal space [Baker], left knee: Secondary | ICD-10-CM | POA: Diagnosis not present

## 2020-03-20 DIAGNOSIS — M1712 Unilateral primary osteoarthritis, left knee: Secondary | ICD-10-CM | POA: Diagnosis not present

## 2020-03-20 DIAGNOSIS — G8929 Other chronic pain: Secondary | ICD-10-CM | POA: Diagnosis not present

## 2020-03-21 DIAGNOSIS — M1712 Unilateral primary osteoarthritis, left knee: Secondary | ICD-10-CM | POA: Diagnosis not present

## 2020-03-21 DIAGNOSIS — G8929 Other chronic pain: Secondary | ICD-10-CM | POA: Diagnosis not present

## 2020-03-21 DIAGNOSIS — M1711 Unilateral primary osteoarthritis, right knee: Secondary | ICD-10-CM | POA: Diagnosis not present

## 2020-03-21 DIAGNOSIS — M7122 Synovial cyst of popliteal space [Baker], left knee: Secondary | ICD-10-CM | POA: Diagnosis not present

## 2020-03-21 DIAGNOSIS — M25562 Pain in left knee: Secondary | ICD-10-CM | POA: Diagnosis not present

## 2020-03-21 DIAGNOSIS — M1120 Other chondrocalcinosis, unspecified site: Secondary | ICD-10-CM | POA: Diagnosis not present

## 2020-03-21 DIAGNOSIS — M25561 Pain in right knee: Secondary | ICD-10-CM | POA: Diagnosis not present

## 2020-03-27 DIAGNOSIS — M5412 Radiculopathy, cervical region: Secondary | ICD-10-CM | POA: Diagnosis not present

## 2020-03-27 DIAGNOSIS — M5136 Other intervertebral disc degeneration, lumbar region: Secondary | ICD-10-CM | POA: Diagnosis not present

## 2020-03-27 DIAGNOSIS — M5030 Other cervical disc degeneration, unspecified cervical region: Secondary | ICD-10-CM | POA: Diagnosis not present

## 2020-03-27 DIAGNOSIS — M5416 Radiculopathy, lumbar region: Secondary | ICD-10-CM | POA: Diagnosis not present

## 2020-03-31 DIAGNOSIS — R0602 Shortness of breath: Secondary | ICD-10-CM | POA: Diagnosis not present

## 2020-04-17 DIAGNOSIS — M1120 Other chondrocalcinosis, unspecified site: Secondary | ICD-10-CM | POA: Diagnosis not present

## 2020-04-17 DIAGNOSIS — M25461 Effusion, right knee: Secondary | ICD-10-CM | POA: Diagnosis not present

## 2020-04-17 DIAGNOSIS — M25561 Pain in right knee: Secondary | ICD-10-CM | POA: Diagnosis not present

## 2020-04-17 DIAGNOSIS — M25562 Pain in left knee: Secondary | ICD-10-CM | POA: Diagnosis not present

## 2020-04-17 DIAGNOSIS — G8929 Other chronic pain: Secondary | ICD-10-CM | POA: Diagnosis not present

## 2020-04-17 DIAGNOSIS — M170 Bilateral primary osteoarthritis of knee: Secondary | ICD-10-CM | POA: Diagnosis not present

## 2020-04-17 DIAGNOSIS — M7122 Synovial cyst of popliteal space [Baker], left knee: Secondary | ICD-10-CM | POA: Diagnosis not present

## 2020-04-24 DIAGNOSIS — M25561 Pain in right knee: Secondary | ICD-10-CM | POA: Diagnosis not present

## 2020-04-24 DIAGNOSIS — G8929 Other chronic pain: Secondary | ICD-10-CM | POA: Diagnosis not present

## 2020-04-24 DIAGNOSIS — M7122 Synovial cyst of popliteal space [Baker], left knee: Secondary | ICD-10-CM | POA: Diagnosis not present

## 2020-04-24 DIAGNOSIS — M25562 Pain in left knee: Secondary | ICD-10-CM | POA: Diagnosis not present

## 2020-04-24 DIAGNOSIS — M1711 Unilateral primary osteoarthritis, right knee: Secondary | ICD-10-CM | POA: Diagnosis not present

## 2020-05-01 DIAGNOSIS — M7122 Synovial cyst of popliteal space [Baker], left knee: Secondary | ICD-10-CM | POA: Diagnosis not present

## 2020-05-01 DIAGNOSIS — M25461 Effusion, right knee: Secondary | ICD-10-CM | POA: Diagnosis not present

## 2020-05-01 DIAGNOSIS — M1120 Other chondrocalcinosis, unspecified site: Secondary | ICD-10-CM | POA: Diagnosis not present

## 2020-05-01 DIAGNOSIS — G8929 Other chronic pain: Secondary | ICD-10-CM | POA: Diagnosis not present

## 2020-05-01 DIAGNOSIS — M25562 Pain in left knee: Secondary | ICD-10-CM | POA: Diagnosis not present

## 2020-05-01 DIAGNOSIS — M25561 Pain in right knee: Secondary | ICD-10-CM | POA: Diagnosis not present

## 2020-05-01 DIAGNOSIS — M170 Bilateral primary osteoarthritis of knee: Secondary | ICD-10-CM | POA: Diagnosis not present

## 2020-05-21 ENCOUNTER — Emergency Department
Admission: EM | Admit: 2020-05-21 | Discharge: 2020-05-21 | Disposition: A | Discharge status: Home / Self Care | Payer: Medicare HMO | Attending: Emergency Medicine | Admitting: Emergency Medicine

## 2020-05-21 ENCOUNTER — Emergency Department: Payer: Medicare HMO

## 2020-05-21 ENCOUNTER — Other Ambulatory Visit: Payer: Self-pay

## 2020-05-21 ENCOUNTER — Encounter: Payer: Self-pay | Admitting: Emergency Medicine

## 2020-05-21 DIAGNOSIS — S52591A Other fractures of lower end of right radius, initial encounter for closed fracture: Secondary | ICD-10-CM | POA: Diagnosis not present

## 2020-05-21 DIAGNOSIS — S0990XA Unspecified injury of head, initial encounter: Secondary | ICD-10-CM | POA: Diagnosis not present

## 2020-05-21 DIAGNOSIS — Z87891 Personal history of nicotine dependence: Secondary | ICD-10-CM | POA: Insufficient documentation

## 2020-05-21 DIAGNOSIS — S0083XA Contusion of other part of head, initial encounter: Secondary | ICD-10-CM | POA: Diagnosis not present

## 2020-05-21 DIAGNOSIS — Y9301 Activity, walking, marching and hiking: Secondary | ICD-10-CM | POA: Diagnosis not present

## 2020-05-21 DIAGNOSIS — Y929 Unspecified place or not applicable: Secondary | ICD-10-CM | POA: Insufficient documentation

## 2020-05-21 DIAGNOSIS — Y999 Unspecified external cause status: Secondary | ICD-10-CM | POA: Diagnosis not present

## 2020-05-21 DIAGNOSIS — Z7982 Long term (current) use of aspirin: Secondary | ICD-10-CM | POA: Insufficient documentation

## 2020-05-21 DIAGNOSIS — Z79899 Other long term (current) drug therapy: Secondary | ICD-10-CM | POA: Diagnosis not present

## 2020-05-21 DIAGNOSIS — Z85828 Personal history of other malignant neoplasm of skin: Secondary | ICD-10-CM | POA: Insufficient documentation

## 2020-05-21 DIAGNOSIS — M25539 Pain in unspecified wrist: Secondary | ICD-10-CM | POA: Diagnosis not present

## 2020-05-21 DIAGNOSIS — S52571A Other intraarticular fracture of lower end of right radius, initial encounter for closed fracture: Secondary | ICD-10-CM | POA: Diagnosis not present

## 2020-05-21 DIAGNOSIS — R52 Pain, unspecified: Secondary | ICD-10-CM | POA: Diagnosis not present

## 2020-05-21 DIAGNOSIS — I10 Essential (primary) hypertension: Secondary | ICD-10-CM | POA: Diagnosis not present

## 2020-05-21 DIAGNOSIS — W010XXA Fall on same level from slipping, tripping and stumbling without subsequent striking against object, initial encounter: Secondary | ICD-10-CM | POA: Insufficient documentation

## 2020-05-21 DIAGNOSIS — W19XXXA Unspecified fall, initial encounter: Secondary | ICD-10-CM

## 2020-05-21 DIAGNOSIS — G9389 Other specified disorders of brain: Secondary | ICD-10-CM | POA: Diagnosis not present

## 2020-05-21 DIAGNOSIS — J341 Cyst and mucocele of nose and nasal sinus: Secondary | ICD-10-CM | POA: Diagnosis not present

## 2020-05-21 DIAGNOSIS — S0285XA Fracture of orbit, unspecified, initial encounter for closed fracture: Secondary | ICD-10-CM | POA: Diagnosis not present

## 2020-05-21 DIAGNOSIS — I6523 Occlusion and stenosis of bilateral carotid arteries: Secondary | ICD-10-CM | POA: Diagnosis not present

## 2020-05-21 DIAGNOSIS — S6991XA Unspecified injury of right wrist, hand and finger(s), initial encounter: Secondary | ICD-10-CM | POA: Diagnosis not present

## 2020-05-21 DIAGNOSIS — S0231XA Fracture of orbital floor, right side, initial encounter for closed fracture: Secondary | ICD-10-CM | POA: Diagnosis not present

## 2020-05-21 DIAGNOSIS — S199XXA Unspecified injury of neck, initial encounter: Secondary | ICD-10-CM | POA: Diagnosis not present

## 2020-05-21 DIAGNOSIS — T148XXA Other injury of unspecified body region, initial encounter: Secondary | ICD-10-CM | POA: Diagnosis not present

## 2020-05-21 DIAGNOSIS — R58 Hemorrhage, not elsewhere classified: Secondary | ICD-10-CM | POA: Diagnosis not present

## 2020-05-21 DIAGNOSIS — M25531 Pain in right wrist: Secondary | ICD-10-CM | POA: Diagnosis not present

## 2020-05-21 MED ORDER — OXYCODONE-ACETAMINOPHEN 5-325 MG PO TABS: 1.0000 | ORAL_TABLET | ORAL | 0 refills | Status: DC | PRN

## 2020-05-21 MED ORDER — OXYCODONE-ACETAMINOPHEN 5-325 MG PO TABS
1.0000 | ORAL_TABLET | Freq: Once | ORAL | Status: DC
  Filled 2020-05-21: qty 1

## 2020-05-21 MED ORDER — ACETAMINOPHEN 500 MG PO TABS: 1000.0000 mg | ORAL_TABLET | Freq: Once | ORAL | Status: AC

## 2020-05-21 MED ORDER — ACETAMINOPHEN 500 MG PO TABS
ORAL_TABLET | ORAL | Status: AC
  Administered 2020-05-21: 1000 mg via ORAL
  Filled 2020-05-21: qty 2

## 2020-05-21 MED ORDER — LIDOCAINE-EPINEPHRINE 2 %-1:100000 IJ SOLN
20.0000 mL | Freq: Once | INTRAMUSCULAR | Status: AC
  Administered 2020-05-21: 20 mL
  Filled 2020-05-21: qty 1

## 2020-05-21 MED ORDER — CEPHALEXIN 500 MG PO CAPS: 500.0000 mg | ORAL_CAPSULE | Freq: Four times a day (QID) | ORAL | 0 refills | Status: AC

## 2020-05-21 NOTE — ED Notes (Signed)
Laceration cart placed at bedside at this time. Pt visualized in NAD at this time, resting in bed, pt provided with elevation for wrist for comfort. Pt's daughter remains at bedside. Call bell remains within reach of patient at this time.

## 2020-05-21 NOTE — ED Triage Notes (Signed)
Pt presents to ED via [facility] from home with c/o fall. Per EMS pt fell this morning while walking, per EMS pt tripped and fell and landed on R side. Per EMS pt c/o R wrist pain and pain to her head, pt with small abrasion to R eyebrow at this time.   156/90 72 95% RA

## 2020-05-21 NOTE — ED Notes (Signed)
Pt transported to CT at this time.

## 2020-05-21 NOTE — ED Notes (Signed)
Fall risk bracelet applied to patient at this time, fall risk sign placed outside door at this time.

## 2020-05-21 NOTE — ED Notes (Signed)
EDP at bedside at this time.  

## 2020-05-21 NOTE — ED Notes (Signed)
Pt transported to radiology at this time °

## 2020-05-21 NOTE — ED Notes (Signed)
Sugar tong splint applied by Urban Gibson, EDT.

## 2020-05-21 NOTE — ED Provider Notes (Signed)
[facility] Emergency Department Provider Note   ____________________________________________   First MD Initiated Contact with Patient 05/21/20 0831     (approximate)  I have reviewed the triage vital signs and the nursing notes.   HISTORY  Chief Complaint Fall    HPI Katherine Mosley Katherine Mosley is a 79 y.o. female with possible history of hypertension, hyperlipidemia who presents to the ED following fall. Patient reports that she was out walking her usual route this morning when she tripped and fell, striking her head and her right wrist. She did not lose consciousness and does not take any blood thinners. She did notice immediate onset of pain with deformity to her right wrist, has been unable to use her right hand since then due to pain. She denies any numbness or weakness in her digits on that right hand. She has not had any pain in her chest, abdomen, hips or lower extremities, has been ambulatory since the fall.        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   Past Surgical History:  Procedure Laterality Date   ABDOMINAL HYSTERECTOMY     BRAIN SURGERY     TONSILLECTOMY      Prior to Admission medications   Medication Sig Start Date End Date Taking? Authorizing Provider  acetaminophen (TYLENOL) 500 MG tablet Take 500 mg by mouth every 6 (six) hours as needed.    [provider]  alendronate (FOSAMAX) 70 MG tablet Take 70 mg by mouth once a week. Take with a full glass of water on an empty stomach.    [provider]  aspirin 81 MG tablet Take 81 mg by mouth daily.    [provider]  calcium-vitamin D (OSCAL WITH D) 500-200 MG-UNIT tablet Take 1 tablet by mouth.    [provider]  cephALEXin (KEFLEX) 500 MG capsule Take 1 capsule (500 mg  total) by mouth 4 (four) times daily for 5 days. 05/21/20 05/26/20  Blake Divine, MD  guaiFENesin (MUCINEX) 600 MG 12 hr tablet Take 1 tablet (600 mg total) by mouth 2 (two) times daily as needed. 10/25/15   Andria Meuse, NP  loratadine (CLARITIN) 10 MG tablet Take 10 mg by mouth daily.    [provider]  losartan (COZAAR) 100 MG tablet Take 100 mg by mouth daily.    [provider]  metoprolol (LOPRESSOR) 50 MG tablet Take 50 mg by mouth once.    [provider]  mometasone (NASONEX) 50 MCG/ACT nasal spray Place 2 sprays into the nose 2 (two) times daily. 10/25/15   Andria Meuse, NP  oxyCODONE-acetaminophen (PERCOCET) 5-325 MG tablet Take 1 tablet by mouth every 4 (four) hours as needed for severe pain. 05/21/20 05/21/21  Blake Divine, MD  pravastatin (PRAVACHOL) 40 MG tablet Take 40 mg by mouth daily.    [provider]  predniSONE (DELTASONE) 10 MG tablet Take 1 tablet (10 mg total) by mouth daily. 6,5,4,3,2,1 six day taper 02/18/18   Duanne Guess, PA-C  ranitidine (ZANTAC) 150 MG tablet Take 150 mg by mouth 2 (two) times daily.    [provider]  traMADol (ULTRAM) 50 MG tablet Take 1 tablet (50 mg total) by mouth every 6 (six) hours as needed.  02/18/18   Duanne Guess, PA-C    Allergies Prednisone and Sulfur  Family History  Problem Relation Age of Onset   Ovarian cancer Sister 14   Lung cancer Brother    Leukemia Brother    Breast cancer Neg Hx     Social History Social History   Tobacco Use   Smoking status: Former Smoker    Packs/day: 0.50    Quit date: 02/20/2018    Years since quitting: 2.2   Smokeless tobacco: Never Used  Substance Use Topics   Alcohol use: No   Drug use: No    Review of Systems  Constitutional: No fever/chills Eyes: No visual changes. ENT: No sore throat. Cardiovascular: Denies chest pain. Respiratory: Denies shortness of breath. Gastrointestinal: No abdominal pain.  No nausea, no  vomiting.  No diarrhea.  No constipation. Genitourinary: Negative for dysuria. Musculoskeletal: Negative for back pain. Positive for right wrist pain. Skin: Negative for rash. Neurological: Negative for headaches, focal weakness or numbness.  ____________________________________________   PHYSICAL EXAM:  VITAL SIGNS: ED Triage Vitals  Enc Vitals Group     BP      Pulse      Resp      Temp      Temp src      SpO2      Weight      Height      Head Circumference      Peak Flow      Pain Score      Pain Loc      Pain Edu?      Excl. in [place]?     Constitutional: Alert and oriented. Eyes: Conjunctivae are normal. Head: Small abrasion to right eyebrow with no facial bony tenderness. Nose: No congestion/rhinnorhea. Mouth/Throat: Mucous membranes are moist. Neck: Normal ROM Cardiovascular: Normal rate, regular rhythm. Grossly normal heart sounds. Respiratory: Normal respiratory effort.  No retractions. Lungs CTAB. Gastrointestinal: Soft and nontender. No distention. Genitourinary: deferred Musculoskeletal: No lower extremity tenderness nor edema. Diffuse tenderness to right wrist with obvious deformity. 2+ radial pulses bilaterally. Able to move all digits on right hand. Neurologic:  Normal speech and language. No gross focal neurologic deficits are appreciated. Skin:  Skin is warm, dry and intact. No rash noted. Psychiatric: Mood and affect are normal. Speech and behavior are normal.  ____________________________________________   LABS (all labs ordered are listed, but only abnormal results are displayed)  Labs Reviewed - No data to display   PROCEDURES  Procedure(s) performed (including Critical Care):  .Ortho Injury Treatment  Date/Time: 05/21/2020 3:54 PM Performed by: Blake Divine, MD Authorized by: Blake Divine, MD   Consent:    Consent obtained:  Verbal   Consent given by:  PatientInjury location: wrist Location details: right wrist Injury type:  fracture Fracture type: distal radius Pre-procedure neurovascular assessment: neurovascularly intact Pre-procedure distal perfusion: normal Pre-procedure neurological function: normal Pre-procedure range of motion: reduced  Anesthesia: Local anesthesia used: no  Patient sedated: NoManipulation performed: no Immobilization: splint Splint type: sugar tong Post-procedure neurovascular assessment: post-procedure neurovascularly intact Post-procedure distal perfusion: normal Post-procedure neurological function: normal Post-procedure range of motion: unchanged Patient tolerance: patient tolerated the procedure well with no immediate complications      ____________________________________________   INITIAL IMPRESSION / ASSESSMENT AND PLAN / ED COURSE       79 year old female presents to the ED following mechanical fall onto the sidewalk striking her head and her right wrist. She has an obvious deformity to her right wrist  but is neurovascularly intact throughout her right hand. Suspect distal radius fracture and we will further assess with x-ray. She has an abrasion just above her right eyebrow, but did not lose consciousness and is not on any blood thinners, no focal deficits noted on neuro exam. We will further assess with CT head and C-spine. Patient declines any pain medicine at this time.  CT head and C-spine show no intracranial or cervical spine injury, but there is inferior orbital blowout fracture further assessed on maxillofacial CT.  Patient has no evidence of entrapment on my exam as her extraocular movements are intact and she has no double vision.  No evidence of retrobulbar hematoma.  We will prescribe Keflex and provide with referral to ENT.  She also has comminuted fracture of her right distal radius with intra-articular extension.  Case discussed with Dr. Posey Pronto of orthopedics, who agrees with plan for sugar tong splint placement and outpatient follow-up.  Patient provided  with referral to Dr. Rudene Christians, counseled to return to the ED for new worsening symptoms.      ____________________________________________   FINAL CLINICAL IMPRESSION(S) / ED DIAGNOSES  Final diagnoses:  Fall, initial encounter  Other closed intra-articular fracture of distal end of right radius, initial encounter  Closed fracture of orbit, initial encounter [facility])     ED Discharge Orders         Ordered    oxyCODONE-acetaminophen (PERCOCET) 5-325 MG tablet  Every 4 hours PRN     Discontinue  Reprint     05/21/20 1242    cephALEXin (KEFLEX) 500 MG capsule  4 times daily     Discontinue  Reprint     05/21/20 1242           Note:  This document was prepared using Dragon voice recognition software and may include unintentional dictation errors.   Blake Divine, MD 05/21/20 [phone]

## 2020-05-22 ENCOUNTER — Other Ambulatory Visit: Payer: Self-pay | Admitting: Orthopedic Surgery

## 2020-05-22 ENCOUNTER — Encounter
Admission: RE | Disposition: A | Discharge status: Home / Self Care | Payer: Self-pay | Source: Ambulatory Visit | Attending: Orthopedic Surgery

## 2020-05-22 ENCOUNTER — Ambulatory Visit
Admission: RE | Admit: 2020-05-22 | Discharge: 2020-05-23 | Disposition: A | Discharge status: Home / Self Care | Payer: Medicare HMO | Source: Ambulatory Visit | Attending: Orthopedic Surgery | Admitting: Orthopedic Surgery

## 2020-05-22 ENCOUNTER — Other Ambulatory Visit: Payer: Self-pay

## 2020-05-22 ENCOUNTER — Encounter: Payer: Self-pay | Admitting: Orthopedic Surgery

## 2020-05-22 ENCOUNTER — Ambulatory Visit: Payer: Medicare HMO | Admitting: Anesthesiology

## 2020-05-22 ENCOUNTER — Ambulatory Visit: Payer: Medicare HMO

## 2020-05-22 ENCOUNTER — Other Ambulatory Visit
Admission: RE | Admit: 2020-05-22 | Discharge: 2020-05-22 | Disposition: A | Discharge status: Home / Self Care | Payer: Medicare HMO | Source: Ambulatory Visit | Attending: Orthopedic Surgery | Admitting: Orthopedic Surgery

## 2020-05-22 DIAGNOSIS — J449 Chronic obstructive pulmonary disease, unspecified: Secondary | ICD-10-CM | POA: Diagnosis not present

## 2020-05-22 DIAGNOSIS — Z87891 Personal history of nicotine dependence: Secondary | ICD-10-CM | POA: Diagnosis not present

## 2020-05-22 DIAGNOSIS — Z888 Allergy status to other drugs, medicaments and biological substances status: Secondary | ICD-10-CM | POA: Diagnosis not present

## 2020-05-22 DIAGNOSIS — M25531 Pain in right wrist: Secondary | ICD-10-CM | POA: Diagnosis not present

## 2020-05-22 DIAGNOSIS — I129 Hypertensive chronic kidney disease with stage 1 through stage 4 chronic kidney disease, or unspecified chronic kidney disease: Secondary | ICD-10-CM | POA: Insufficient documentation

## 2020-05-22 DIAGNOSIS — Z8781 Personal history of (healed) traumatic fracture: Secondary | ICD-10-CM

## 2020-05-22 DIAGNOSIS — W19XXXA Unspecified fall, initial encounter: Secondary | ICD-10-CM | POA: Diagnosis not present

## 2020-05-22 DIAGNOSIS — Z8249 Family history of ischemic heart disease and other diseases of the circulatory system: Secondary | ICD-10-CM | POA: Diagnosis not present

## 2020-05-22 DIAGNOSIS — Z9889 Other specified postprocedural states: Secondary | ICD-10-CM

## 2020-05-22 DIAGNOSIS — E785 Hyperlipidemia, unspecified: Secondary | ICD-10-CM | POA: Insufficient documentation

## 2020-05-22 DIAGNOSIS — Z7982 Long term (current) use of aspirin: Secondary | ICD-10-CM | POA: Insufficient documentation

## 2020-05-22 DIAGNOSIS — S52501A Unspecified fracture of the lower end of right radius, initial encounter for closed fracture: Secondary | ICD-10-CM | POA: Diagnosis not present

## 2020-05-22 DIAGNOSIS — Z79899 Other long term (current) drug therapy: Secondary | ICD-10-CM | POA: Diagnosis not present

## 2020-05-22 DIAGNOSIS — S52571A Other intraarticular fracture of lower end of right radius, initial encounter for closed fracture: Secondary | ICD-10-CM | POA: Insufficient documentation

## 2020-05-22 DIAGNOSIS — Y9301 Activity, walking, marching and hiking: Secondary | ICD-10-CM | POA: Insufficient documentation

## 2020-05-22 DIAGNOSIS — S6991XA Unspecified injury of right wrist, hand and finger(s), initial encounter: Secondary | ICD-10-CM | POA: Diagnosis not present

## 2020-05-22 DIAGNOSIS — M1990 Unspecified osteoarthritis, unspecified site: Secondary | ICD-10-CM | POA: Diagnosis not present

## 2020-05-22 DIAGNOSIS — Z85828 Personal history of other malignant neoplasm of skin: Secondary | ICD-10-CM | POA: Insufficient documentation

## 2020-05-22 DIAGNOSIS — N183 Chronic kidney disease, stage 3 unspecified: Secondary | ICD-10-CM | POA: Diagnosis not present

## 2020-05-22 DIAGNOSIS — W010XXA Fall on same level from slipping, tripping and stumbling without subsequent striking against object, initial encounter: Secondary | ICD-10-CM | POA: Diagnosis not present

## 2020-05-22 DIAGNOSIS — Z882 Allergy status to sulfonamides status: Secondary | ICD-10-CM | POA: Diagnosis not present

## 2020-05-22 DIAGNOSIS — Z20822 Contact with and (suspected) exposure to covid-19: Secondary | ICD-10-CM | POA: Insufficient documentation

## 2020-05-22 DIAGNOSIS — M19031 Primary osteoarthritis, right wrist: Secondary | ICD-10-CM | POA: Diagnosis not present

## 2020-05-22 DIAGNOSIS — G8918 Other acute postprocedural pain: Secondary | ICD-10-CM | POA: Diagnosis not present

## 2020-05-22 DIAGNOSIS — G5601 Carpal tunnel syndrome, right upper limb: Secondary | ICD-10-CM | POA: Diagnosis not present

## 2020-05-22 DIAGNOSIS — Z01810 Encounter for preprocedural cardiovascular examination: Secondary | ICD-10-CM | POA: Diagnosis not present

## 2020-05-22 DIAGNOSIS — Z419 Encounter for procedure for purposes other than remedying health state, unspecified: Secondary | ICD-10-CM

## 2020-05-22 HISTORY — DX: Gastro-esophageal reflux disease without esophagitis: K21.9

## 2020-05-22 HISTORY — DX: Depression, unspecified: F32.A

## 2020-05-22 HISTORY — DX: Chronic kidney disease, stage 3 unspecified: N18.30

## 2020-05-22 HISTORY — DX: Other specified postprocedural states: Z98.890

## 2020-05-22 HISTORY — DX: Unspecified osteoarthritis, unspecified site: M19.90

## 2020-05-22 HISTORY — DX: Nausea with vomiting, unspecified: R11.2

## 2020-05-22 HISTORY — DX: Chronic obstructive pulmonary disease, unspecified: J44.9

## 2020-05-22 HISTORY — PX: OPEN REDUCTION INTERNAL FIXATION (ORIF) DISTAL RADIAL FRACTURE: SHX5989

## 2020-05-22 LAB — SARS CORONAVIRUS 2 BY RT PCR (HOSPITAL ORDER, PERFORMED IN ~~LOC~~ HOSPITAL LAB): SARS Coronavirus 2: NEGATIVE

## 2020-05-22 SURGERY — OPEN REDUCTION INTERNAL FIXATION (ORIF) DISTAL RADIUS FRACTURE
Anesthesia: General | Site: Wrist | Laterality: Right

## 2020-05-22 MED ORDER — DEXAMETHASONE SODIUM PHOSPHATE 10 MG/ML IJ SOLN
INTRAMUSCULAR | Status: AC
  Filled 2020-05-22: qty 1

## 2020-05-22 MED ORDER — LIDOCAINE HCL (PF) 2 % IJ SOLN
INTRAMUSCULAR | Status: AC
  Filled 2020-05-22: qty 5

## 2020-05-22 MED ORDER — LOSARTAN POTASSIUM 50 MG PO TABS
100.0000 mg | ORAL_TABLET | Freq: Every day | ORAL | Status: DC
  Administered 2020-05-23: 100 mg via ORAL
  Filled 2020-05-22: qty 2

## 2020-05-22 MED ORDER — ZOLPIDEM TARTRATE 5 MG PO TABS: 5.0000 mg | ORAL_TABLET | Freq: Every evening | ORAL | Status: DC | PRN

## 2020-05-22 MED ORDER — FENTANYL CITRATE (PF) 100 MCG/2ML IJ SOLN
INTRAMUSCULAR | Status: AC
  Filled 2020-05-22: qty 2

## 2020-05-22 MED ORDER — ORAL CARE MOUTH RINSE: 15.0000 mL | Freq: Once | OROMUCOSAL | Status: DC

## 2020-05-22 MED ORDER — CEPHALEXIN 500 MG PO CAPS
500.0000 mg | ORAL_CAPSULE | Freq: Four times a day (QID) | ORAL | Status: DC
  Administered 2020-05-22 – 2020-05-23 (×2): 500 mg via ORAL
  Filled 2020-05-22 (×2): qty 1

## 2020-05-22 MED ORDER — HYDROMORPHONE HCL 1 MG/ML IJ SOLN: 0.5000 mg | INTRAMUSCULAR | Status: DC | PRN

## 2020-05-22 MED ORDER — METOCLOPRAMIDE HCL 10 MG PO TABS: 5.0000 mg | ORAL_TABLET | Freq: Three times a day (TID) | ORAL | Status: DC | PRN

## 2020-05-22 MED ORDER — CHLORHEXIDINE GLUCONATE 0.12 % MT SOLN: 15.0000 mL | Freq: Once | OROMUCOSAL | Status: DC

## 2020-05-22 MED ORDER — ASPIRIN EC 81 MG PO TBEC
81.0000 mg | DELAYED_RELEASE_TABLET | Freq: Every day | ORAL | Status: DC
  Administered 2020-05-23: 81 mg via ORAL
  Filled 2020-05-22: qty 1

## 2020-05-22 MED ORDER — MAGNESIUM HYDROXIDE 400 MG/5ML PO SUSP
30.0000 mL | Freq: Every day | ORAL | Status: DC | PRN
  Administered 2020-05-23: 30 mL via ORAL
  Filled 2020-05-22: qty 30

## 2020-05-22 MED ORDER — LIDOCAINE HCL (PF) 1 % IJ SOLN
INTRAMUSCULAR | Status: AC
  Filled 2020-05-22: qty 5

## 2020-05-22 MED ORDER — FENTANYL CITRATE (PF) 100 MCG/2ML IJ SOLN
INTRAMUSCULAR | Status: DC | PRN
  Administered 2020-05-22 (×2): 25 ug via INTRAVENOUS

## 2020-05-22 MED ORDER — BUPIVACAINE HCL (PF) 0.5 % IJ SOLN
INTRAMUSCULAR | Status: AC
  Filled 2020-05-22: qty 20

## 2020-05-22 MED ORDER — METOCLOPRAMIDE HCL 5 MG/ML IJ SOLN: 5.0000 mg | Freq: Three times a day (TID) | INTRAMUSCULAR | Status: DC | PRN

## 2020-05-22 MED ORDER — CEFAZOLIN SODIUM-DEXTROSE 2-4 GM/100ML-% IV SOLN
INTRAVENOUS | Status: AC
  Filled 2020-05-22: qty 100

## 2020-05-22 MED ORDER — BUPIVACAINE HCL (PF) 0.5 % IJ SOLN
INTRAMUSCULAR | Status: DC | PRN
Start: 2020-05-22 — End: 2020-05-22
  Administered 2020-05-22: 7 mL via PERINEURAL
  Administered 2020-05-22: 13 mL via PERINEURAL

## 2020-05-22 MED ORDER — CEFAZOLIN SODIUM-DEXTROSE 2-4 GM/100ML-% IV SOLN
2.0000 g | INTRAVENOUS | Status: AC
  Administered 2020-05-22: 2 g via INTRAVENOUS

## 2020-05-22 MED ORDER — ONDANSETRON HCL 4 MG/2ML IJ SOLN
INTRAMUSCULAR | Status: AC
  Filled 2020-05-22: qty 2

## 2020-05-22 MED ORDER — METOPROLOL TARTRATE 50 MG PO TABS
50.0000 mg | ORAL_TABLET | Freq: Every day | ORAL | Status: DC
  Administered 2020-05-23: 50 mg via ORAL
  Filled 2020-05-22: qty 1

## 2020-05-22 MED ORDER — ONDANSETRON HCL 4 MG PO TABS: 4.0000 mg | ORAL_TABLET | Freq: Four times a day (QID) | ORAL | Status: DC | PRN

## 2020-05-22 MED ORDER — ACETAMINOPHEN 10 MG/ML IV SOLN
INTRAVENOUS | Status: DC | PRN
  Administered 2020-05-22: 1000 mg via INTRAVENOUS

## 2020-05-22 MED ORDER — SODIUM CHLORIDE 0.9 % IV SOLN: INTRAVENOUS | Status: DC

## 2020-05-22 MED ORDER — NEOMYCIN-POLYMYXIN B GU 40-200000 IR SOLN
Status: DC | PRN
  Administered 2020-05-22: 2 mL

## 2020-05-22 MED ORDER — PHENYLEPHRINE HCL (PRESSORS) 10 MG/ML IV SOLN
INTRAVENOUS | Status: DC | PRN
  Administered 2020-05-22: 100 ug via INTRAVENOUS

## 2020-05-22 MED ORDER — FENTANYL CITRATE (PF) 100 MCG/2ML IJ SOLN: 25.0000 ug | INTRAMUSCULAR | Status: DC | PRN

## 2020-05-22 MED ORDER — DOCUSATE SODIUM 100 MG PO CAPS
100.0000 mg | ORAL_CAPSULE | Freq: Two times a day (BID) | ORAL | Status: DC
  Administered 2020-05-22 – 2020-05-23 (×2): 100 mg via ORAL
  Filled 2020-05-22 (×2): qty 1

## 2020-05-22 MED ORDER — ACETAMINOPHEN 325 MG PO TABS: 325.0000 mg | ORAL_TABLET | Freq: Four times a day (QID) | ORAL | Status: DC | PRN

## 2020-05-22 MED ORDER — ONDANSETRON HCL 4 MG/2ML IJ SOLN
INTRAMUSCULAR | Status: DC | PRN
  Administered 2020-05-22: 4 mg via INTRAVENOUS

## 2020-05-22 MED ORDER — OXYCODONE HCL 5 MG PO TABS: 10.0000 mg | ORAL_TABLET | ORAL | Status: DC | PRN

## 2020-05-22 MED ORDER — CALCIUM CARBONATE-VITAMIN D 500-200 MG-UNIT PO TABS
1.0000 | ORAL_TABLET | Freq: Every day | ORAL | Status: DC
  Administered 2020-05-23: 1 via ORAL
  Filled 2020-05-22: qty 1

## 2020-05-22 MED ORDER — PROPOFOL 10 MG/ML IV BOLUS
INTRAVENOUS | Status: DC | PRN
  Administered 2020-05-22: 140 mg via INTRAVENOUS

## 2020-05-22 MED ORDER — METHOCARBAMOL 1000 MG/10ML IJ SOLN
500.0000 mg | Freq: Four times a day (QID) | INTRAVENOUS | Status: DC | PRN
  Filled 2020-05-22: qty 5

## 2020-05-22 MED ORDER — FENTANYL CITRATE (PF) 100 MCG/2ML IJ SOLN
INTRAMUSCULAR | Status: AC
  Administered 2020-05-22: 50 ug
  Filled 2020-05-22: qty 2

## 2020-05-22 MED ORDER — SEVOFLURANE IN SOLN
RESPIRATORY_TRACT | Status: AC
  Filled 2020-05-22: qty 250

## 2020-05-22 MED ORDER — OXYCODONE HCL 5 MG/5ML PO SOLN: 5.0000 mg | Freq: Once | ORAL | Status: DC | PRN

## 2020-05-22 MED ORDER — ACETAMINOPHEN 500 MG PO TABS
1000.0000 mg | ORAL_TABLET | Freq: Four times a day (QID) | ORAL | Status: DC
  Administered 2020-05-22 – 2020-05-23 (×3): 1000 mg via ORAL
  Filled 2020-05-22 (×3): qty 2

## 2020-05-22 MED ORDER — FLUTICASONE PROPIONATE 50 MCG/ACT NA SUSP
1.0000 | Freq: Every day | NASAL | Status: DC
  Administered 2020-05-23: 1 via NASAL
  Filled 2020-05-22: qty 16

## 2020-05-22 MED ORDER — LIDOCAINE HCL (CARDIAC) PF 100 MG/5ML IV SOSY
PREFILLED_SYRINGE | INTRAVENOUS | Status: DC | PRN
  Administered 2020-05-22: 100 mg via INTRAVENOUS

## 2020-05-22 MED ORDER — LACTATED RINGERS IV SOLN: INTRAVENOUS | Status: DC

## 2020-05-22 MED ORDER — PROMETHAZINE HCL 25 MG/ML IJ SOLN: 6.2500 mg | INTRAMUSCULAR | Status: DC | PRN

## 2020-05-22 MED ORDER — METHOCARBAMOL 500 MG PO TABS: 500.0000 mg | ORAL_TABLET | Freq: Four times a day (QID) | ORAL | Status: DC | PRN

## 2020-05-22 MED ORDER — LIDOCAINE HCL (PF) 2 % IJ SOLN
INTRAMUSCULAR | Status: DC | PRN
  Administered 2020-05-22: 60 mg via PERINEURAL
  Administered 2020-05-22: 140 mg via PERINEURAL

## 2020-05-22 MED ORDER — LORATADINE 10 MG PO TABS
10.0000 mg | ORAL_TABLET | Freq: Every day | ORAL | Status: DC
  Administered 2020-05-23: 10 mg via ORAL
  Filled 2020-05-22: qty 1

## 2020-05-22 MED ORDER — PRAVASTATIN SODIUM 20 MG PO TABS
40.0000 mg | ORAL_TABLET | Freq: Every day | ORAL | Status: DC
  Administered 2020-05-23: 40 mg via ORAL
  Filled 2020-05-22: qty 2

## 2020-05-22 MED ORDER — OXYCODONE HCL 5 MG PO TABS: 5.0000 mg | ORAL_TABLET | Freq: Once | ORAL | Status: DC | PRN

## 2020-05-22 MED ORDER — DEXAMETHASONE SODIUM PHOSPHATE 10 MG/ML IJ SOLN
INTRAMUSCULAR | Status: DC | PRN
  Administered 2020-05-22: 8 mg via INTRAVENOUS

## 2020-05-22 MED ORDER — ACETAMINOPHEN 10 MG/ML IV SOLN
INTRAVENOUS | Status: AC
  Filled 2020-05-22: qty 100

## 2020-05-22 MED ORDER — OXYCODONE HCL 5 MG PO TABS: 5.0000 mg | ORAL_TABLET | ORAL | Status: DC | PRN

## 2020-05-22 MED ORDER — ONDANSETRON HCL 4 MG/2ML IJ SOLN: 4.0000 mg | Freq: Four times a day (QID) | INTRAMUSCULAR | Status: DC | PRN

## 2020-05-22 MED ORDER — MIDAZOLAM HCL 2 MG/2ML IJ SOLN
INTRAMUSCULAR | Status: AC
  Administered 2020-05-22: 1 mg
  Filled 2020-05-22: qty 2

## 2020-05-22 SURGICAL SUPPLY — 41 items
BIT DRILL 2.5X4 QC (BIT) ×3 IMPLANT
BNDG ELASTIC 4X5.8 VLCR STR LF (GAUZE/BANDAGES/DRESSINGS) ×3 IMPLANT
CANISTER SUCT 1200ML W/VALVE (MISCELLANEOUS) ×3 IMPLANT
CHLORAPREP W/TINT 26 (MISCELLANEOUS) ×3 IMPLANT
COVER WAND RF STERILE (DRAPES) ×3 IMPLANT
CUFF TOURN SGL QUICK 18X4 (TOURNIQUET CUFF) ×3 IMPLANT
DRAPE FLUOR MINI C-ARM 54X84 (DRAPES) ×3 IMPLANT
DRIVER PEG 2.0 FAST (BIT) ×3 IMPLANT
ELECT REM PT RETURN 9FT ADLT (ELECTROSURGICAL) ×3
ELECTRODE REM PT RTRN 9FT ADLT (ELECTROSURGICAL) ×1 IMPLANT
GAUZE SPONGE 4X4 12PLY STRL (GAUZE/BANDAGES/DRESSINGS) ×3 IMPLANT
GAUZE XEROFORM 1X8 LF (GAUZE/BANDAGES/DRESSINGS) ×6 IMPLANT
GLOVE SURG SYN 9.0  PF PI (GLOVE) ×2
GLOVE SURG SYN 9.0 PF PI (GLOVE) ×1 IMPLANT
GOWN SRG 2XL LVL 4 RGLN SLV (GOWNS) ×1 IMPLANT
GOWN STRL NON-REIN 2XL LVL4 (GOWNS) ×2
GOWN STRL REUS W/ TWL LRG LVL3 (GOWN DISPOSABLE) ×1 IMPLANT
GOWN STRL REUS W/TWL LRG LVL3 (GOWN DISPOSABLE) ×2
K-WIRE 1.6 (WIRE) ×2
K-WIRE FX5X1.6XNS BN SS (WIRE) ×1
KIT TURNOVER KIT A (KITS) ×3 IMPLANT
KWIRE FX5X1.6XNS BN SS (WIRE) ×1 IMPLANT
NEEDLE FILTER BLUNT 18X 1/2SAF (NEEDLE) ×2
NEEDLE FILTER BLUNT 18X1 1/2 (NEEDLE) ×1 IMPLANT
NS IRRIG 500ML POUR BTL (IV SOLUTION) ×3 IMPLANT
PACK EXTREMITY (MISCELLANEOUS) ×3 IMPLANT
PAD CAST CTTN 4X4 STRL (SOFTGOODS) ×2 IMPLANT
PADDING CAST COTTON 4X4 STRL (SOFTGOODS) ×4
PEG SUBCHONDRAL SMOOTH 2.0X16 (Peg) ×3 IMPLANT
PEG SUBCHONDRAL SMOOTH 2.0X20 (Peg) ×3 IMPLANT
PEG SUBCHONDRAL SMOOTH 2.0X22 (Peg) ×9 IMPLANT
PEG SUBCHONDRAL SMOOTH 2.0X24 (Peg) ×3 IMPLANT
PLATE STAN 21.6X57.2 NRRW RT (Plate) ×3 IMPLANT
SCALPEL PROTECTED #15 DISP (BLADE) ×6 IMPLANT
SCREW CORT 3.5X10 LNG (Screw) ×12 IMPLANT
SPLINT CAST 1 STEP 3X12 (MISCELLANEOUS) ×3 IMPLANT
SUT ETHILON 4-0 (SUTURE) ×2
SUT ETHILON 4-0 FS2 18XMFL BLK (SUTURE) ×1
SUT VICRYL 3-0 27IN (SUTURE) ×3 IMPLANT
SUTURE ETHLN 4-0 FS2 18XMF BLK (SUTURE) ×1 IMPLANT
SYR 3ML LL SCALE MARK (SYRINGE) ×3 IMPLANT

## 2020-05-22 NOTE — Op Note (Signed)
05/22/2020  2:42 PM  PATIENT:  Katherine Mosley  79 y.o. female  PRE-OPERATIVE DIAGNOSIS:  Right Wrist Fracture with right carpal tunnel syndrome  POST-OPERATIVE DIAGNOSIS:  Right Wrist Fracture with right carpal tunnel syndrome  PROCEDURE:  Procedure(s): OPEN REDUCTION INTERNAL FIXATION (ORIF) DISTAL RADIAL FRACTURE WITH CARPAL TUNNEL RELEASE (Right)  SURGEON: Laurene Footman, MD  ASSISTANTS: None  ANESTHESIA:   general  EBL:  Total I/O In: 1100 [I.V.:900; IV Piggyback:200] Out: 3 [Blood:3]  BLOOD ADMINISTERED:none  DRAINS: none   LOCAL MEDICATIONS USED:  OTHER block was performed by anesthesia preoperatively  SPECIMEN:  No Specimen  DISPOSITION OF SPECIMEN:  N/A  COUNTS:  YES  TOURNIQUET:   Total Tourniquet Time Documented: Forearm (Right) - 31 minutes Total: Forearm (Right) - 31 minutes   IMPLANTS: [employer] standard narrow plate, right with multiple smooth pegs and screws  DICTATION: .Dragon Dictation  patient was brought to the operating room and after adequate general anesthesia was obtained the left arm was prepped and draped in the usual sterile fashion with a tourniquet applied the upper arm.  After patient identification and timeout procedures were completed a volar approach was made after raising the tourniquet with the incision centered on the FCR tendon.  The tendon sheath incised and the tendons retracted radially protecting the radial artery and associated veins.  Deep fascia was then incised and the muscles retracted ulnarly with deep retractor placed the pronator was elevated off the shaft and distal fragments to provide exposure of the fracture.  With the fingertrap traction most of the length of been restored.  With gentle traction and use of freer elevator the 3 distal fragments were manipulated to near anatomic alignment and then the plate was applied with K wire holding it in position.  Distal first approach was then utilized with the plate  applied to the distal fragment with K wire holding in position checking on AP and lateral imaging.  The 6 distal screw holes were then drilled measured and then smooth pegs placed when these had all been set the plate was brought to the shaft and 3 10 mm screws inserted.  The pins were longer than usual to try to get capture of the dorsal fragments which was successful.  After traction was removed and under range of motion the dorsal fragments appeared stable. Traction was removed and under fluoroscopic examination there is no penetration into the joint and the fracture was stable and essentially anatomic alignment.    The carpal tunnel release was then carried out with incision and from the distal flexion crease in line with the ring metacarpal approximately 2 cm in length with skin and subcutaneous tissue incised the transcarpal ligament was identified with some aberrant thenar musculature overlying the transcarpal ligament which was elevated.  The transcarpal ligament was then incised in a small portion with a vascular hemostat placed deep to the underlying structures were protected release carried out distally until fat was noted around the nerve proximally there was compression at the level of the wrist flexion crease for about 1 cm after release there was no apparent constriction on the nerve both wounds were thoroughly irrigated and tourniquet let down wounds were closed with 3-0 Vicryl for the fracture fixation incision followed by 4-0 nylon for the skin edges the 4-0 nylon simple erupted skin closure for the carpal tunnel surgery.  The wound was irrigated and then tourniquet let down.  The wound was closed with 3-0 Vicryl subcutaneously and 4-0 nylon  for the skin in a simple interrupted fashion.  Xeroform 4 x 4's web roll volar splint and Ace wrap applied. Follow-up date intake  PLAN OF CARE: Admit for overnight observation  PATIENT DISPOSITION:  PACU - hemodynamically stable.

## 2020-05-22 NOTE — Anesthesia Procedure Notes (Signed)
Anesthesia Regional Block: Supraclavicular block   Pre-Anesthetic Checklist: ,, timeout performed, Correct Patient, Correct Site, Correct Laterality, Correct Procedure, Correct Position, site marked, Risks and benefits discussed,  Surgical consent,  Pre-op evaluation,  At surgeon's request and post-op pain management  Laterality: Upper and Right  Prep: chloraprep       Needles:  Injection technique: Single-shot  Needle Type: Stimiplex     Needle Length: 5cm  Needle Gauge: 22     Additional Needles:   Procedures:,,,, ultrasound used (permanent image in chart),,,,  Narrative:  Start time: 05/22/2020 12:22 PM End time: 05/22/2020 12:25 PM Injection made incrementally with aspirations every 5 mL.  Performed by: Personally  Anesthesiologist: Latonya Nelon, Precious Haws, MD  Additional Notes: Patient consented for risk and benefits of nerve block including but not limited to nerve damage, failed block, bleeding and infection.  Patient voiced understanding.  Functioning IV was confirmed and monitors were applied.  Timeout done prior to procedure and prior to any sedation being given to the patient.  Patient confirmed procedure site prior to any sedation given to the patient.  A 26mm 22ga Stimuplex needle was used. Sterile prep,hand hygiene and sterile gloves were used.  Minimal sedation used for procedure.  No paresthesia endorsed by patient during the procedure.  Negative aspiration and negative test dose prior to incremental administration of local anesthetic. The patient tolerated the procedure well with no immediate complications.

## 2020-05-22 NOTE — Anesthesia Postprocedure Evaluation (Signed)
Anesthesia Post Note  Patient: [name]  Procedure(s) Performed: OPEN REDUCTION INTERNAL FIXATION (ORIF) DISTAL RADIAL FRACTURE WITH CARPAL TUNNEL RELEASE (Right Wrist)  Patient location during evaluation: PACU Anesthesia Type: General Level of consciousness: awake and alert Pain management: pain level controlled Vital Signs Assessment: post-procedure vital signs reviewed and stable Respiratory status: spontaneous breathing, nonlabored ventilation, respiratory function stable and patient connected to nasal cannula oxygen Cardiovascular status: blood pressure returned to baseline and stable Postop Assessment: no apparent nausea or vomiting Anesthetic complications: no   No complications documented.   Last Vitals:  Vitals:   05/22/20 1501 05/22/20 1517  BP: 140/68 134/75  Pulse: 81 81  Resp: (!) 22 19  Temp:    SpO2: 90% 97%    Last Pain:  Vitals:   05/22/20 1501  TempSrc:   PainSc: 0-No pain                 Katherine Mosley Katherine Mosley

## 2020-05-22 NOTE — Anesthesia Preprocedure Evaluation (Signed)
Anesthesia Evaluation  Patient identified by MRN, date of birth, ID band Patient awake    Reviewed: Allergy & Precautions, H&P , NPO status , Patient's Chart, lab work & pertinent test results  History of Anesthesia Complications (+) PONV and history of anesthetic complications  Airway Mallampati: III  TM Distance: <3 FB Neck ROM: limited    Dental  (+) Chipped   Pulmonary neg shortness of breath, COPD, former smoker,    Pulmonary exam normal        Cardiovascular Exercise Tolerance: Good hypertension, Normal cardiovascular exam     Neuro/Psych PSYCHIATRIC DISORDERS negative neurological ROS     GI/Hepatic Neg liver ROS, GERD  ,  Endo/Other  negative endocrine ROS  Renal/GU Renal disease     Musculoskeletal   Abdominal   Peds  Hematology negative hematology ROS (+)   Anesthesia Other Findings Past Medical History: No date: Cancer ([place])     Comment:  basil skin surgery- corner of right eye  No date: COPD (chronic obstructive pulmonary disease) ([facility]) No date: Depression No date: GERD (gastroesophageal reflux disease) No date: Hyperlipidemia No date: Hypertension No date: Osteoarthritis No date: PONV (postoperative nausea and vomiting) No date: Seasonal allergies No date: Stage 3 chronic kidney disease  Past Surgical History: No date: ABDOMINAL HYSTERECTOMY 1980: BRAIN SURGERY     Comment:  craniotomy for encephalitis; 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 No date: TONSILLECTOMY  BMI    Body Mass Index: 30.99 kg/m      Reproductive/Obstetrics negative OB ROS                             Anesthesia Physical Anesthesia Plan  ASA: III  Anesthesia Plan: General LMA   Post-op Pain Management: GA combined w/ Regional for post-op pain   Induction: Intravenous  PONV Risk Score and Plan: Dexamethasone, Ondansetron, Midazolam and Treatment may vary due to age or medical condition  Airway  Management Planned: LMA  Additional Equipment:   Intra-op Plan:   Post-operative Plan: Extubation in OR  Informed Consent: I have reviewed the patients History and Physical, chart, labs and discussed the procedure including the risks, benefits and alternatives for the proposed anesthesia with the patient or authorized representative who has indicated his/her understanding and acceptance.     Dental Advisory Given  Plan Discussed with: Anesthesiologist, CRNA and Surgeon  Anesthesia Plan Comments: (Patient consented for risks of anesthesia including but not limited to:  - adverse reactions to medications - damage to eyes, teeth, lips or other oral mucosa - nerve damage due to positioning  - sore throat or hoarseness - Damage to heart, brain, nerves, lungs, other parts of body or loss of life  Patient voiced understanding.)        Anesthesia Quick Evaluation

## 2020-05-22 NOTE — H&P (Signed)
Chief Complaint  Patient presents with  . Hospital Follow Up  Rt Distal Radius fracture   Katherine Mosley is a 79 y.o. female who presents today for evaluation of a right distal radius fracture. Patient fell yesterday 05/21/2020 she was walking outside. She landed on her right wrist and suffered a comminuted displaced and impacted distal radial metaphyseal fracture. Pain has been moderate. She is right-hand dominant. She is very independent. She reports some numbness and tingling that she has been having present for greater than 1 year with nighttime awakening. 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 She denies any elbow or shoulder pain.  Past Medical History: Past Medical History:  Diagnosis Date  . COPD (chronic obstructive pulmonary disease) (CMS-HCC)  . Depression  . Dysphagia  . Environmental allergies  . Hyperlipidemia  . Hypertension  . Osteoarthritis  a. Hands b. Cervical spine  . Osteoporosis  . S/P craniotomy  cranial osteomyelitis, encephalitis, s/p craniotomy  . Tobacco abuse   Past Surgical History: Past Surgical History:  Procedure Laterality Date  . Craniotomy  . CRANIOTOMY  brain  . HYSTERECTOMY  . Nasal surgery   Past Family History: Family History  Problem Relation Age of Onset  . Dementia Mother  . High blood pressure (Hypertension) Mother  . Stroke Mother  . Kidney disease Father  . High blood pressure (Hypertension) Brother  . Heart disease Brother  . Cancer Brother  . Kidney disease Brother  . Arthritis Other  . Kidney disease Other  . Gout Other  . Stroke Other  . High blood pressure (Hypertension) Brother  . Ovarian cancer Sister  . Cancer Sister  . Stroke Sister  . Coronary Artery Disease (Blocked arteries around heart) Son  . Seizures Neg Hx   Medications: Current Outpatient Medications Ordered in Epic  Medication Sig Dispense  Refill  . amLODIPine (NORVASC) 5 MG tablet TAKE 1 TABLET EVERY DAY 90 tablet 3  . aspirin 81 MG EC tablet Take 81 mg by mouth once daily.  . cephalexin (KEFLEX) 500 MG capsule Take 1 capsule by mouth 4 (four) times daily  . cholecalciferol (VITAMIN D3) 1,000 unit capsule Take 1,000 Units by mouth once daily  . CYANOCOBALAMIN/FOLIC ACID (VITAMIN R74-YCXKG ACID) 818-563 mcg Tab Take by mouth once daily.  Marland Kitchen loratadine (CLARITIN) 10 mg tablet Take 10 mg by mouth once daily.  Marland Kitchen losartan (COZAAR) 100 MG tablet TAKE 1 TABLET (100 MG TOTAL) BY MOUTH ONCE DAILY 90 tablet 3  . metoprolol tartrate (LOPRESSOR) 50 MG tablet TAKE 1 TABLET EVERY DAY 90 tablet 1  . peg 400-propylene glycol, PF, (SYSTANE ULTRA) 0.4-0.3 % ophthalmic drops Place 1 drop into both eyes as directed for Dry Eyes.  . rosuvastatin (CRESTOR) 10 MG tablet TAKE 1 TABLET EVERY DAY 90 tablet 3  . tiotropium (SPIRIVA) 18 mcg inhalation capsule Place 1 capsule (18 mcg total) into inhaler and inhale once daily 30 capsule 11  . WIXELA INHUB 250-50 mcg/dose diskus inhaler INHALE 1 PUFF INTO THE LUNGS EVERY 12 HOURS 1 Inhaler 3   No current Epic-ordered facility-administered medications on file.   Allergies: Allergies  Allergen Reactions  . Prednisone Unknown  . Sulfa (Sulfonamide Antibiotics) Unknown  . Sulfur Unknown  . Zoloft [Sertraline] Vomiting    Review of Systems:  A comprehensive 14 point ROS was performed, reviewed by me today, and the pertinent orthopaedic findings are documented in  the HPI.  Exam: BP 152/86  Ht 156.2 cm (5' 1.5")  Wt 74.7 kg (164 lb 9.6 oz)  LMP (LMP Unknown)  BMI 30.60 kg/m  General:  Well developed, well nourished, no apparent distress, normal affect, normal gait with no antalgic component.   HEENT: Head normocephalic, atraumatic, PERRL.   Abdomen: Soft, non tender, non distended, Bowel sounds present.  Heart: Examination of the heart reveals regular, rate, and rhythm. There is no murmur noted  on ascultation. There is a normal apical pulse.  Lungs: Lungs are clear to auscultation. There is no wheeze, rhonchi, or crackles. There is normal expansion of bilateral chest walls.   Examination right upper extremity shows patient is in a well fitted sugar tong splint with sling. Minimal swelling throughout the digits with sensation that is intact. 2+ radial pulse. No skin breakdown noted. Unable to tolerate Phalen's test.  EXAM:  RIGHT WRIST - COMPLETE 3+ VIEW   COMPARISON: None.   FINDINGS:  Frontal, oblique, and lateral views were obtained. There is a  comminuted fracture of the distal radial metaphysis with fracture  fragments extending into the radiocarpal joint. A fracture fragment  extends into the distal radial diaphysis with alignment in this area  anatomic. Multiple fracture fragments are displaced dorsally and  ventrally with respect to the shaft of the radius.   No other fracture evident. No dislocation. There is extensive  osteoarthritic change in the first carpal-metacarpal joint with  remodeling of the trapezium. There is mild narrowing of the  scaphotrapezial joint. There are scattered foci of calcification in  the triangular fibrocartilage.   IMPRESSION:  Comminuted fracture distal radius with fracture extending into the  radiocarpal joint. Multiple displaced fracture fragments evident.   Calcification in the triangular fibrocartilage may represent residua  ofchronic tear in this area.   Osteoarthritic change, severe in the first carpal-metacarpal joint.   Electronically Signed  By: Lowella Grip III M.D.  On: 05/21/2020 09:16  EMG nerve conduction studies from 12/27/2018 show right moderate carpal tunnel syndrome.  Impression: Other closed intra-articular fracture of distal end of right radius, initial encounter [S52.571A] Other closed intra-articular fracture of distal end of right radius, initial encounter (primary encounter diagnosis) Carpal  tunnel syndrome of right wrist  Plan:  1. Risks, benefits, complications of a right carpal tunnel release with right distal radius open reduction internal fixation of a displaced impacted intra-articular distal radial metaphyseal fracture has been discussed with the patient. Patient has agreed and consented to procedure today with Dr. Hessie Knows. Patient is n.p.o.  This note was generated in part with voice recognition software and I apologize for any typographical errors that were not detected and corrected.  Feliberto Gottron MPA-C    Electronically signed by Feliberto Gottron, PA at 05/22/2020 9:29 AM EDT

## 2020-05-22 NOTE — Transfer of Care (Signed)
Immediate Anesthesia Transfer of Care Note  Patient: Katherine Mosley  Procedure(s) Performed: OPEN REDUCTION INTERNAL FIXATION (ORIF) DISTAL RADIAL FRACTURE WITH CARPAL TUNNEL RELEASE (Right Wrist)  Patient Location: PACU  Anesthesia Type:GA combined with regional for post-op pain  Level of Consciousness: awake, drowsy and patient cooperative  Airway & Oxygen Therapy: Patient Spontanous Breathing and Patient connected to face mask oxygen  Post-op Assessment: Report given to RN and Post -op Vital signs reviewed and stable  Post vital signs: Reviewed and stable  Last Vitals:  Vitals Value Taken Time  BP 140/68 05/22/20 1430  Temp 36.2 C 05/22/20 1430  Pulse 83 05/22/20 1436  Resp 18 05/22/20 1436  SpO2 99 % 05/22/20 1436  Vitals shown include unvalidated device data.  Last Pain:  Vitals:   05/22/20 1430  TempSrc:   PainSc: Asleep         Complications: No complications documented.

## 2020-05-22 NOTE — Anesthesia Procedure Notes (Signed)
Procedure Name: LMA Insertion Date/Time: 05/22/2020 1:21 PM Performed by: Lowry Bowl, CRNA Pre-anesthesia Checklist: Patient identified, Suction available, Emergency Drugs available and Patient being monitored Patient Re-evaluated:Patient Re-evaluated prior to induction Oxygen Delivery Method: Circle system utilized Preoxygenation: Pre-oxygenation with 100% oxygen Induction Type: IV induction LMA: LMA inserted LMA Size: 3.0 Number of attempts: 1 Placement Confirmation: breath sounds checked- equal and bilateral and positive ETCO2 Tube secured with: Tape Dental Injury: Teeth and Oropharynx as per pre-operative assessment

## 2020-05-23 ENCOUNTER — Encounter: Payer: Self-pay | Admitting: Orthopedic Surgery

## 2020-05-23 DIAGNOSIS — Z20822 Contact with and (suspected) exposure to covid-19: Secondary | ICD-10-CM | POA: Diagnosis not present

## 2020-05-23 DIAGNOSIS — E785 Hyperlipidemia, unspecified: Secondary | ICD-10-CM | POA: Diagnosis not present

## 2020-05-23 DIAGNOSIS — Z7982 Long term (current) use of aspirin: Secondary | ICD-10-CM | POA: Diagnosis not present

## 2020-05-23 DIAGNOSIS — Z79899 Other long term (current) drug therapy: Secondary | ICD-10-CM | POA: Diagnosis not present

## 2020-05-23 DIAGNOSIS — I129 Hypertensive chronic kidney disease with stage 1 through stage 4 chronic kidney disease, or unspecified chronic kidney disease: Secondary | ICD-10-CM | POA: Diagnosis not present

## 2020-05-23 DIAGNOSIS — N183 Chronic kidney disease, stage 3 unspecified: Secondary | ICD-10-CM | POA: Diagnosis not present

## 2020-05-23 DIAGNOSIS — S52571A Other intraarticular fracture of lower end of right radius, initial encounter for closed fracture: Secondary | ICD-10-CM | POA: Diagnosis not present

## 2020-05-23 DIAGNOSIS — G5601 Carpal tunnel syndrome, right upper limb: Secondary | ICD-10-CM | POA: Diagnosis not present

## 2020-05-23 DIAGNOSIS — J449 Chronic obstructive pulmonary disease, unspecified: Secondary | ICD-10-CM | POA: Diagnosis not present

## 2020-05-23 MED ORDER — HYDROCODONE-ACETAMINOPHEN 5-325 MG PO TABS: 1.0000 | ORAL_TABLET | ORAL | 0 refills | Status: DC | PRN

## 2020-05-23 NOTE — Discharge Summary (Signed)
Physician Discharge Summary  Patient ID: Katherine Mosley MRN: [id number] DOB/AGE: Apr 14, 1941 79 y.o.  Admit date: 05/22/2020 Discharge date: 05/23/2020  Admission Diagnoses:  S/P ORIF (open reduction internal fixation) fracture [Z98.890, Z87.81]   Discharge Diagnoses: Patient Active Problem List   Diagnosis Date Noted  . S/P ORIF (open reduction internal fixation) fracture 05/22/2020    Past Medical History:  Diagnosis Date  . 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 COPD (chronic obstructive pulmonary disease) ([place])   . Depression   . GERD (gastroesophageal reflux disease)   . Hyperlipidemia   . Hypertension   . Osteoarthritis   . PONV (postoperative nausea and vomiting)   . Seasonal allergies   . Stage 3 chronic kidney disease      Transfusion: none   Consultants (if any):   Discharged Condition: Improved  Hospital Course: Katherine Mosley is an 79 y.o. female who was admitted 05/22/2020 with a diagnosis of <principal problem not specified> and went to the operating room on 05/22/2020 and underwent the above named procedures.    Surgeries: Procedure(s): OPEN REDUCTION INTERNAL FIXATION (ORIF) DISTAL RADIAL FRACTURE WITH CARPAL TUNNEL RELEASE on 05/22/2020 Patient tolerated the surgery well. Taken to PACU where she was stabilized and then transferred to the orthopedic floor.  Throughout the night, patient tolerated pain well.  Pain control with Tylenol.  She was able to get up and ambulate throughout the room to go to the restroom with no complications.  On postop day 1 patient was stable, vital signs stable, patient ready for discharge to home.  Implants: [employer] standard narrow plate, right with multiple smooth pegs and screws  She was given perioperative antibiotics:  Anti-infectives (From admission, onward)   Start     Dose/Rate Route Frequency Ordered Stop   05/22/20 1800  cephALEXin (KEFLEX) capsule 500 mg     Discontinue     500 mg  Oral 4 times daily 05/22/20 1752     05/22/20 1115  ceFAZolin (ANCEF) IVPB 2g/100 mL premix        2 g 200 mL/hr over 30 Minutes Intravenous On call to O.R. 05/22/20 1109 05/22/20 1327   05/22/20 1111  ceFAZolin (ANCEF) 2-4 GM/100ML-% IVPB       Note to Pharmacy: Josephina Shih   : cabinet override      05/22/20 1111 05/22/20 1332    .  She was given sequential compression devices, early ambulation, for DVT prophylaxis.  She benefited maximally from the hospital stay and there were no complications.    Recent vital signs:  Vitals:   05/23/20 0411 05/23/20 0741  BP: 138/67 130/70  Pulse: 87 83  Resp: 16 17  Temp: 98 F (36.7 C) 97.8 F (36.6 C)  SpO2: 93% 95%    Recent laboratory studies:  No results found for: HGB No results found for: WBC, PLT No results found for: INR No results found for: NA, K, CL, CO2, BUN, CREATININE, GLUCOSE  Discharge Medications:   Allergies as of 05/23/2020      Reactions   Prednisone    Makes head feel funny   Sulfa Antibiotics    Makes head feel funny   Zoloft [sertraline] Nausea And Vomiting      Medication List    STOP taking these medications   oxyCODONE-acetaminophen 5-325 MG tablet Commonly known as: Percocet     TAKE these medications   acetaminophen 500 MG tablet Commonly known as: TYLENOL Take  500 mg by mouth every 6 (six) hours as needed.   aspirin 81 MG tablet Take 81 mg by mouth daily.   calcium-vitamin D 500-200 MG-UNIT tablet Commonly known as: OSCAL WITH D Take 1 tablet by mouth.   cephALEXin 500 MG capsule Commonly known as: KEFLEX Take 1 capsule (500 mg total) by mouth 4 (four) times daily for 5 days.   HYDROcodone-acetaminophen 5-325 MG tablet Commonly known as: Norco Take 1 tablet by mouth every 4 (four) hours as needed for moderate pain.   loratadine 10 MG tablet Commonly known as: CLARITIN Take 10 mg by mouth daily.   losartan 100 MG tablet Commonly known as: COZAAR Take 100 mg by mouth daily.    metoprolol tartrate 50 MG tablet Commonly known as: LOPRESSOR Take 50 mg by mouth daily.   mometasone 50 MCG/ACT nasal spray Commonly known as: Nasonex Place 2 sprays into the nose 2 (two) times daily.   pravastatin 40 MG tablet Commonly known as: PRAVACHOL Take 40 mg by mouth daily.       Diagnostic Studies: DG Wrist 2 Views Right  Result Date: 05/22/2020 CLINICAL DATA:  Pain following trauma EXAM: RIGHT WRIST - 2 VIEW COMPARISON:  May 21, 2020. FINDINGS: Frontal and lateral views were obtained. There is screw and plate fixation through a comminuted fracture of the distal radius with alignment near anatomic in this region. No other fractures are evident. No dislocation. There is extensive osteoarthritic change in the first carpal-metacarpal joint with remodeling of the trapezium. Foci of calcification are noted in the triangular fibrocartilage. IMPRESSION: Postoperative screw and plate fixation in the distal radius with alignment in this area near anatomic. Extensive osteoarthritic change in the first carpal-metacarpal joint. Calcification in the triangular fibrocartilage region raises question of chronic tear in this area. No new fracture evident. No dislocation. Electronically Signed   By: Lowella Grip III M.D.   On: 05/22/2020 15:28   DG Wrist Complete Right  Result Date: 05/21/2020 CLINICAL DATA:  Pain following fall EXAM: RIGHT WRIST - COMPLETE 3+ VIEW COMPARISON:  None. FINDINGS: Frontal, oblique, and lateral views were obtained. There is a comminuted fracture of the distal radial metaphysis with fracture fragments extending into the radiocarpal joint. A fracture fragment extends into the distal radial diaphysis with alignment in this area anatomic. Multiple fracture fragments are displaced dorsally and ventrally with respect to the shaft of the radius. No other fracture evident. No dislocation. There is extensive osteoarthritic change in the first carpal-metacarpal joint with  remodeling of the trapezium. There is mild narrowing of the scaphotrapezial joint. There are scattered foci of calcification in the triangular fibrocartilage. IMPRESSION: Comminuted fracture distal radius with fracture extending into the radiocarpal joint. Multiple displaced fracture fragments evident. Calcification in the triangular fibrocartilage may represent residua of chronic tear in this area. Osteoarthritic change, severe in the first carpal-metacarpal joint. Electronically Signed   By: Lowella Grip III M.D.   On: 05/21/2020 09:16   CT Head Wo Contrast  Result Date: 05/21/2020 CLINICAL DATA:  [place]. Hit head. EXAM: CT HEAD WITHOUT CONTRAST CT CERVICAL SPINE WITHOUT CONTRAST TECHNIQUE: Multidetector CT imaging of the head and cervical spine was performed following the standard protocol without intravenous contrast. Multiplanar CT image reconstructions of the cervical spine were also generated. COMPARISON:  Head CT 05/08/2018 FINDINGS: CT HEAD FINDINGS Brain: Remote postoperative changes with fairly extensive frontal craniotomy and large area of encephalomalacia involving the frontal lobes, left much larger than right. No acute intracranial findings. No extra-axial  fluid collections are identified. The brainstem and cerebellum are grossly normal. Vascular: Moderate vascular calcifications but no aneurysm or hyperdense vessels. Skull: Large frontal craniotomy defect. No acute skull fracture. Sinuses/Orbits: Fluid noted in the right maxillary sinus. The mastoid air cells and middle ear cavities are clear. Other: Suspect a right orbital floor fracture. Maxillofacial CT may be helpful for further evaluation. CT CERVICAL SPINE FINDINGS Alignment: Advanced degenerative cervical spondylosis with multilevel disc disease and facet disease and multilevel degenerative subluxations. The overall alignment is maintained. Skull base and vertebrae: No acute fracture. No primary bone lesion or focal pathologic process.  Soft tissues and spinal canal: No prevertebral fluid or swelling. No visible canal hematoma. Disc levels: The spinal canal is fairly generous. No significant spinal stenosis. Multilevel facet disease with mild bilateral foraminal stenosis at multiple levels. Upper chest: The visualized lung apices are grossly clear. Other: Bilateral carotid artery calcifications are noted. IMPRESSION: 1. Remote postoperative changes with fairly extensive frontal craniotomy and large area of encephalomalacia involving the frontal lobes, left much larger than right. 2. No acute intracranial findings or skull fracture. 3. Suspect right orbital floor fracture. Maxillofacial CT may be helpful for further evaluation. 4. Advanced degenerative cervical spondylosis with multilevel disc disease and facet disease but no acute cervical spine fracture. Electronically Signed   By: Marijo Sanes M.D.   On: 05/21/2020 09:24   CT Cervical Spine Wo Contrast  Result Date: 05/21/2020 CLINICAL DATA:  [place]. Hit head. EXAM: CT HEAD WITHOUT CONTRAST CT CERVICAL SPINE WITHOUT CONTRAST TECHNIQUE: Multidetector CT imaging of the head and cervical spine was performed following the standard protocol without intravenous contrast. Multiplanar CT image reconstructions of the cervical spine were also generated. COMPARISON:  Head CT 05/08/2018 FINDINGS: CT HEAD FINDINGS Brain: Remote postoperative changes with fairly extensive frontal craniotomy and large area of encephalomalacia involving the frontal lobes, left much larger than right. No acute intracranial findings. No extra-axial fluid collections are identified. The brainstem and cerebellum are grossly normal. Vascular: Moderate vascular calcifications but no aneurysm or hyperdense vessels. Skull: Large frontal craniotomy defect. No acute skull fracture. Sinuses/Orbits: Fluid noted in the right maxillary sinus. The mastoid air cells and middle ear cavities are clear. Other: Suspect a right orbital floor  fracture. Maxillofacial CT may be helpful for further evaluation. CT CERVICAL SPINE FINDINGS Alignment: Advanced degenerative cervical spondylosis with multilevel disc disease and facet disease and multilevel degenerative subluxations. The overall alignment is maintained. Skull base and vertebrae: No acute fracture. No primary bone lesion or focal pathologic process. Soft tissues and spinal canal: No prevertebral fluid or swelling. No visible canal hematoma. Disc levels: The spinal canal is fairly generous. No significant spinal stenosis. Multilevel facet disease with mild bilateral foraminal stenosis at multiple levels. Upper chest: The visualized lung apices are grossly clear. Other: Bilateral carotid artery calcifications are noted. IMPRESSION: 1. Remote postoperative changes with fairly extensive frontal craniotomy and large area of encephalomalacia involving the frontal lobes, left much larger than right. 2. No acute intracranial findings or skull fracture. 3. Suspect right orbital floor fracture. Maxillofacial CT may be helpful for further evaluation. 4. Advanced degenerative cervical spondylosis with multilevel disc disease and facet disease but no acute cervical spine fracture. Electronically Signed   By: Marijo Sanes M.D.   On: 05/21/2020 09:24   [place] OR NERVE BLOCK-IMAGE ONLY [facility])  Result Date: 05/22/2020 There is no interpretation for this exam.  This order is for images obtained during a surgical procedure.  Please See "Surgeries" Tab  for more information regarding the procedure.   CT Maxillofacial WO CM  Result Date: 05/21/2020 CLINICAL DATA:  Facial trauma with head pain EXAM: CT MAXILLOFACIAL WITHOUT CONTRAST TECHNIQUE: Multidetector CT imaging of the maxillofacial structures was performed. Multiplanar CT image reconstructions were also generated. COMPARISON:  Head CT from earlier today FINDINGS: Osseous: Acute right orbital floor blow-out fracture which involves the infraorbital canal and is  mildly depressed. The inferior rectus is not herniated or rounded. Remote bifrontal craniotomy with cranioplasty. Intact and located mandible. Orbits: No evidence of postseptal injury. Floor fracture on the right as described above. Sinuses: Right maxillary hemosinus. Right frontal opacification and expansion, mucocele appearance. Soft tissues: Contusion to the right cheek. Limited intracranial: Left frontal lobe encephalomalacia. IMPRESSION: 1. Right orbital blowout fracture without extraocular muscle or fat herniation. 2. Remote craniotomy and cranioplasty with mucocele findings at the right frontal sinus. Electronically Signed   By: Monte Fantasia M.D.   On: 05/21/2020 10:20    Disposition: Discharge disposition: 01-Home or [facility] Follow up.   Why: Follow-up with [facility] first of next week for dressing change and Velcro splint application. Contact information: [place]  [id number] [phone]                Signed: Feliberto Gottron 05/23/2020, 8:18 AM

## 2020-05-23 NOTE — Plan of Care (Signed)

## 2020-05-23 NOTE — Discharge Instructions (Signed)
Diet: As you were doing prior to hospitalization   Shower:  May shower but keep the right upper extremity splint clean and dry.   Splint: Keep splint clean and dry at all times.  Do not remove splint.  We will remove splint at first postop appointment  Activity: No lifting greater than 6 ounce cup of coffee with right upper extremity.  Work on gentle range of motion of the digits.  To prevent constipation: you may use a stool softener such as -  Colace (over the counter) 100 mg by mouth twice a day  Drink plenty of fluids (prune juice may be helpful) and high fiber foods Miralax (over the counter) for constipation as needed.    Itching:  If you experience itching with your medications, try taking only a single pain pill, or even half a pain pill at a time.     Precautions:  If you experience chest pain or shortness of breath - call 911 immediately for transfer to the hospital emergency department!!  If you develop a fever greater that 101 F, purulent drainage from wound, increased redness or drainage from wound, or calf pain-Call [employer]

## 2020-05-23 NOTE — Progress Notes (Signed)
Discharge summary reviewed with verbal understanding. Answered all questions. Educated regarding finger exercises and elevation. Bowel regime discussed. Escorted to personal vehicle.

## 2020-05-23 NOTE — Progress Notes (Signed)
   Subjective: 1 Day Post-Op Procedure(s) (LRB): OPEN REDUCTION INTERNAL FIXATION (ORIF) DISTAL RADIAL FRACTURE WITH CARPAL TUNNEL RELEASE (Right) Patient reports pain as mild.   Patient is well, and has had no acute complaints or problems Denies any CP, SOB, ABD pain. Patient states she is been able to walk to the bathroom twice, no difficulties. Plan is to go Home after hospital stay.  Objective: Vital signs in last 24 hours: Temp:  [97 F (36.1 C)-98.7 F (37.1 C)] 97.8 F (36.6 C) (07/02 0741) Pulse Rate:  [81-99] 83 (07/02 0741) Resp:  [14-24] 17 (07/02 0741) BP: (114-145)/(53-81) 130/70 (07/02 0741) SpO2:  [90 %-100 %] 95 % (07/02 0741) Weight:  [74.4 kg] 74.4 kg (07/01 1117)  Intake/Output from previous day: 07/01 0701 - 07/02 0700 In: 1850 [I.V.:1650; IV Piggyback:200] Out: 3 [Blood:3] Intake/Output this shift: No intake/output data recorded.  No results for input(s): HGB in the last 72 hours. No results for input(s): WBC, RBC, HCT, PLT in the last 72 hours. No results for input(s): NA, K, CL, CO2, BUN, CREATININE, GLUCOSE, CALCIUM in the last 72 hours. No results for input(s): LABPT, INR in the last 72 hours.  EXAM General - Patient is Alert, Appropriate and Oriented Extremity - Neurovascular intact Sensation intact distally Intact pulses distally No cellulitis present Compartment soft  Moving digits well and right upper extremity.  Improved swelling today from yesterday. Dressing - dressing C/D/I and no drainage, volar splint with Ace wrap intact   Past Medical History:  Diagnosis Date  . 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 COPD (chronic obstructive pulmonary disease) ([place])   . Depression   . GERD (gastroesophageal reflux disease)   . Hyperlipidemia   . Hypertension   . Osteoarthritis   . PONV (postoperative nausea and vomiting)   . Seasonal allergies   . Stage 3 chronic kidney disease     Assessment/Plan:   1 Day Post-Op  Procedure(s) (LRB): OPEN REDUCTION INTERNAL FIXATION (ORIF) DISTAL RADIAL FRACTURE WITH CARPAL TUNNEL RELEASE (Right) Active Problems:   S/P ORIF (open reduction internal fixation) fracture  Estimated body mass index is 30.99 kg/m as calculated from the following:   Height as of this encounter: 5\' 1"  (1.549 m).   Weight as of this encounter: 74.4 kg.  Plan on discharge to home today.  Patient will follow-up with [facility] orthopedics first of next week for recheck.   Ronney Asters, PA-C [place] 05/23/2020, 8:11 AM

## 2020-05-27 DIAGNOSIS — S0231XA Fracture of orbital floor, right side, initial encounter for closed fracture: Secondary | ICD-10-CM | POA: Diagnosis not present

## 2020-05-27 DIAGNOSIS — K116 Mucocele of salivary gland: Secondary | ICD-10-CM | POA: Diagnosis not present

## 2020-05-28 DIAGNOSIS — I10 Essential (primary) hypertension: Secondary | ICD-10-CM | POA: Diagnosis not present

## 2020-06-05 DIAGNOSIS — R55 Syncope and collapse: Secondary | ICD-10-CM | POA: Diagnosis not present

## 2020-06-09 ENCOUNTER — Ambulatory Visit: Payer: Medicare HMO | Admitting: Dermatology

## 2020-06-23 DIAGNOSIS — Z9889 Other specified postprocedural states: Secondary | ICD-10-CM | POA: Insufficient documentation

## 2020-06-23 DIAGNOSIS — M1990 Unspecified osteoarthritis, unspecified site: Secondary | ICD-10-CM | POA: Insufficient documentation

## 2020-06-23 DIAGNOSIS — I10 Essential (primary) hypertension: Secondary | ICD-10-CM | POA: Insufficient documentation

## 2020-06-23 DIAGNOSIS — J449 Chronic obstructive pulmonary disease, unspecified: Secondary | ICD-10-CM | POA: Insufficient documentation

## 2020-06-23 DIAGNOSIS — Z720 Tobacco use: Secondary | ICD-10-CM | POA: Insufficient documentation

## 2020-06-23 DIAGNOSIS — E785 Hyperlipidemia, unspecified: Secondary | ICD-10-CM | POA: Insufficient documentation

## 2020-06-23 DIAGNOSIS — Z9109 Other allergy status, other than to drugs and biological substances: Secondary | ICD-10-CM | POA: Insufficient documentation

## 2020-06-23 DIAGNOSIS — J341 Cyst and mucocele of nose and nasal sinus: Secondary | ICD-10-CM | POA: Diagnosis not present

## 2020-06-23 DIAGNOSIS — J310 Chronic rhinitis: Secondary | ICD-10-CM | POA: Diagnosis not present

## 2020-06-23 DIAGNOSIS — R1310 Dysphagia, unspecified: Secondary | ICD-10-CM | POA: Insufficient documentation

## 2020-06-23 DIAGNOSIS — J321 Chronic frontal sinusitis: Secondary | ICD-10-CM | POA: Diagnosis not present

## 2020-06-23 DIAGNOSIS — J329 Chronic sinusitis, unspecified: Secondary | ICD-10-CM | POA: Diagnosis not present

## 2020-06-30 DIAGNOSIS — J329 Chronic sinusitis, unspecified: Secondary | ICD-10-CM | POA: Insufficient documentation

## 2020-07-03 DIAGNOSIS — Z8781 Personal history of (healed) traumatic fracture: Secondary | ICD-10-CM | POA: Diagnosis not present

## 2020-07-03 DIAGNOSIS — Z9889 Other specified postprocedural states: Secondary | ICD-10-CM | POA: Diagnosis not present

## 2020-08-04 DIAGNOSIS — S52571D Other intraarticular fracture of lower end of right radius, subsequent encounter for closed fracture with routine healing: Secondary | ICD-10-CM | POA: Diagnosis not present

## 2020-08-05 DIAGNOSIS — E782 Mixed hyperlipidemia: Secondary | ICD-10-CM | POA: Diagnosis not present

## 2020-08-05 DIAGNOSIS — I10 Essential (primary) hypertension: Secondary | ICD-10-CM | POA: Diagnosis not present

## 2020-08-05 DIAGNOSIS — I6523 Occlusion and stenosis of bilateral carotid arteries: Secondary | ICD-10-CM | POA: Diagnosis not present

## 2020-08-05 DIAGNOSIS — R55 Syncope and collapse: Secondary | ICD-10-CM | POA: Diagnosis not present

## 2020-09-01 DIAGNOSIS — Z961 Presence of intraocular lens: Secondary | ICD-10-CM | POA: Diagnosis not present

## 2020-09-02 DIAGNOSIS — M25552 Pain in left hip: Secondary | ICD-10-CM | POA: Diagnosis not present

## 2020-09-02 DIAGNOSIS — E7800 Pure hypercholesterolemia, unspecified: Secondary | ICD-10-CM | POA: Diagnosis not present

## 2020-09-02 DIAGNOSIS — Z79899 Other long term (current) drug therapy: Secondary | ICD-10-CM | POA: Diagnosis not present

## 2020-09-02 DIAGNOSIS — E538 Deficiency of other specified B group vitamins: Secondary | ICD-10-CM | POA: Diagnosis not present

## 2020-09-02 DIAGNOSIS — J9811 Atelectasis: Secondary | ICD-10-CM | POA: Diagnosis not present

## 2020-09-02 DIAGNOSIS — J449 Chronic obstructive pulmonary disease, unspecified: Secondary | ICD-10-CM | POA: Diagnosis not present

## 2020-09-02 DIAGNOSIS — N1832 Chronic kidney disease, stage 3b: Secondary | ICD-10-CM | POA: Diagnosis not present

## 2020-09-02 DIAGNOSIS — E559 Vitamin D deficiency, unspecified: Secondary | ICD-10-CM | POA: Diagnosis not present

## 2020-09-02 DIAGNOSIS — J431 Panlobular emphysema: Secondary | ICD-10-CM | POA: Diagnosis not present

## 2020-09-02 DIAGNOSIS — I10 Essential (primary) hypertension: Secondary | ICD-10-CM | POA: Diagnosis not present

## 2020-09-15 DIAGNOSIS — J019 Acute sinusitis, unspecified: Secondary | ICD-10-CM | POA: Diagnosis not present

## 2020-09-15 DIAGNOSIS — R42 Dizziness and giddiness: Secondary | ICD-10-CM | POA: Diagnosis not present

## 2020-09-15 DIAGNOSIS — I10 Essential (primary) hypertension: Secondary | ICD-10-CM | POA: Diagnosis not present

## 2020-09-15 DIAGNOSIS — J431 Panlobular emphysema: Secondary | ICD-10-CM | POA: Diagnosis not present

## 2020-11-19 ENCOUNTER — Other Ambulatory Visit: Payer: Self-pay | Admitting: Internal Medicine

## 2020-11-19 ENCOUNTER — Other Ambulatory Visit: Payer: Self-pay | Admitting: Orthopedic Surgery

## 2020-11-19 DIAGNOSIS — Z1231 Encounter for screening mammogram for malignant neoplasm of breast: Secondary | ICD-10-CM

## 2020-11-28 ENCOUNTER — Other Ambulatory Visit: Payer: Self-pay

## 2020-11-28 ENCOUNTER — Ambulatory Visit
Admission: RE | Admit: 2020-11-28 | Discharge: 2020-11-28 | Disposition: A | Discharge status: Home / Self Care | Payer: Medicare HMO | Source: Ambulatory Visit | Attending: Internal Medicine | Admitting: Internal Medicine

## 2020-11-28 DIAGNOSIS — Z1231 Encounter for screening mammogram for malignant neoplasm of breast: Secondary | ICD-10-CM | POA: Insufficient documentation

## 2020-12-10 DIAGNOSIS — E559 Vitamin D deficiency, unspecified: Secondary | ICD-10-CM | POA: Diagnosis not present

## 2020-12-10 DIAGNOSIS — E538 Deficiency of other specified B group vitamins: Secondary | ICD-10-CM | POA: Diagnosis not present

## 2020-12-10 DIAGNOSIS — Z1211 Encounter for screening for malignant neoplasm of colon: Secondary | ICD-10-CM | POA: Diagnosis not present

## 2020-12-10 DIAGNOSIS — Z0000 Encounter for general adult medical examination without abnormal findings: Secondary | ICD-10-CM | POA: Diagnosis not present

## 2020-12-10 DIAGNOSIS — I10 Essential (primary) hypertension: Secondary | ICD-10-CM | POA: Diagnosis not present

## 2020-12-10 DIAGNOSIS — E7800 Pure hypercholesterolemia, unspecified: Secondary | ICD-10-CM | POA: Diagnosis not present

## 2020-12-10 DIAGNOSIS — M25569 Pain in unspecified knee: Secondary | ICD-10-CM | POA: Diagnosis not present

## 2020-12-15 DIAGNOSIS — M170 Bilateral primary osteoarthritis of knee: Secondary | ICD-10-CM | POA: Diagnosis not present

## 2020-12-15 DIAGNOSIS — M25562 Pain in left knee: Secondary | ICD-10-CM | POA: Diagnosis not present

## 2020-12-15 DIAGNOSIS — M7122 Synovial cyst of popliteal space [Baker], left knee: Secondary | ICD-10-CM | POA: Diagnosis not present

## 2020-12-15 DIAGNOSIS — M25561 Pain in right knee: Secondary | ICD-10-CM | POA: Diagnosis not present

## 2020-12-15 DIAGNOSIS — M11262 Other chondrocalcinosis, left knee: Secondary | ICD-10-CM | POA: Diagnosis not present

## 2020-12-15 DIAGNOSIS — G8929 Other chronic pain: Secondary | ICD-10-CM | POA: Diagnosis not present

## 2020-12-18 DIAGNOSIS — Z1211 Encounter for screening for malignant neoplasm of colon: Secondary | ICD-10-CM | POA: Diagnosis not present

## 2020-12-24 DIAGNOSIS — M7122 Synovial cyst of popliteal space [Baker], left knee: Secondary | ICD-10-CM | POA: Diagnosis not present

## 2020-12-24 DIAGNOSIS — M25561 Pain in right knee: Secondary | ICD-10-CM | POA: Diagnosis not present

## 2020-12-24 DIAGNOSIS — G8929 Other chronic pain: Secondary | ICD-10-CM | POA: Diagnosis not present

## 2020-12-24 DIAGNOSIS — M1712 Unilateral primary osteoarthritis, left knee: Secondary | ICD-10-CM | POA: Diagnosis not present

## 2020-12-24 DIAGNOSIS — M1711 Unilateral primary osteoarthritis, right knee: Secondary | ICD-10-CM | POA: Diagnosis not present

## 2020-12-24 DIAGNOSIS — M1120 Other chondrocalcinosis, unspecified site: Secondary | ICD-10-CM | POA: Diagnosis not present

## 2020-12-24 DIAGNOSIS — M25562 Pain in left knee: Secondary | ICD-10-CM | POA: Diagnosis not present

## 2020-12-31 DIAGNOSIS — M1711 Unilateral primary osteoarthritis, right knee: Secondary | ICD-10-CM | POA: Diagnosis not present

## 2020-12-31 DIAGNOSIS — M25461 Effusion, right knee: Secondary | ICD-10-CM | POA: Diagnosis not present

## 2020-12-31 DIAGNOSIS — M25562 Pain in left knee: Secondary | ICD-10-CM | POA: Diagnosis not present

## 2020-12-31 DIAGNOSIS — G8929 Other chronic pain: Secondary | ICD-10-CM | POA: Diagnosis not present

## 2020-12-31 DIAGNOSIS — M1712 Unilateral primary osteoarthritis, left knee: Secondary | ICD-10-CM | POA: Diagnosis not present

## 2020-12-31 DIAGNOSIS — M7122 Synovial cyst of popliteal space [Baker], left knee: Secondary | ICD-10-CM | POA: Diagnosis not present

## 2020-12-31 DIAGNOSIS — M25561 Pain in right knee: Secondary | ICD-10-CM | POA: Diagnosis not present

## 2021-01-07 DIAGNOSIS — G8929 Other chronic pain: Secondary | ICD-10-CM | POA: Diagnosis not present

## 2021-01-07 DIAGNOSIS — M1120 Other chondrocalcinosis, unspecified site: Secondary | ICD-10-CM | POA: Diagnosis not present

## 2021-01-07 DIAGNOSIS — M25561 Pain in right knee: Secondary | ICD-10-CM | POA: Diagnosis not present

## 2021-01-07 DIAGNOSIS — M25461 Effusion, right knee: Secondary | ICD-10-CM | POA: Diagnosis not present

## 2021-01-07 DIAGNOSIS — M25562 Pain in left knee: Secondary | ICD-10-CM | POA: Diagnosis not present

## 2021-01-07 DIAGNOSIS — M7122 Synovial cyst of popliteal space [Baker], left knee: Secondary | ICD-10-CM | POA: Diagnosis not present

## 2021-01-07 DIAGNOSIS — M170 Bilateral primary osteoarthritis of knee: Secondary | ICD-10-CM | POA: Diagnosis not present

## 2021-01-15 DIAGNOSIS — M5416 Radiculopathy, lumbar region: Secondary | ICD-10-CM | POA: Diagnosis not present

## 2021-01-15 DIAGNOSIS — M5136 Other intervertebral disc degeneration, lumbar region: Secondary | ICD-10-CM | POA: Diagnosis not present

## 2021-01-15 DIAGNOSIS — M48062 Spinal stenosis, lumbar region with neurogenic claudication: Secondary | ICD-10-CM | POA: Diagnosis not present

## 2021-01-22 DIAGNOSIS — M5136 Other intervertebral disc degeneration, lumbar region: Secondary | ICD-10-CM | POA: Diagnosis not present

## 2021-01-22 DIAGNOSIS — M5416 Radiculopathy, lumbar region: Secondary | ICD-10-CM | POA: Diagnosis not present

## 2021-01-22 DIAGNOSIS — M48062 Spinal stenosis, lumbar region with neurogenic claudication: Secondary | ICD-10-CM | POA: Diagnosis not present

## 2021-03-12 DIAGNOSIS — M5136 Other intervertebral disc degeneration, lumbar region: Secondary | ICD-10-CM | POA: Diagnosis not present

## 2021-03-12 DIAGNOSIS — M5416 Radiculopathy, lumbar region: Secondary | ICD-10-CM | POA: Diagnosis not present

## 2021-03-12 DIAGNOSIS — M48062 Spinal stenosis, lumbar region with neurogenic claudication: Secondary | ICD-10-CM | POA: Diagnosis not present

## 2021-03-30 DIAGNOSIS — J431 Panlobular emphysema: Secondary | ICD-10-CM | POA: Diagnosis not present

## 2021-03-30 DIAGNOSIS — E538 Deficiency of other specified B group vitamins: Secondary | ICD-10-CM | POA: Diagnosis not present

## 2021-03-30 DIAGNOSIS — Z79899 Other long term (current) drug therapy: Secondary | ICD-10-CM | POA: Diagnosis not present

## 2021-03-30 DIAGNOSIS — E7800 Pure hypercholesterolemia, unspecified: Secondary | ICD-10-CM | POA: Diagnosis not present

## 2021-03-30 DIAGNOSIS — N1832 Chronic kidney disease, stage 3b: Secondary | ICD-10-CM | POA: Diagnosis not present

## 2021-03-30 DIAGNOSIS — E559 Vitamin D deficiency, unspecified: Secondary | ICD-10-CM | POA: Diagnosis not present

## 2021-03-30 DIAGNOSIS — I129 Hypertensive chronic kidney disease with stage 1 through stage 4 chronic kidney disease, or unspecified chronic kidney disease: Secondary | ICD-10-CM | POA: Diagnosis not present

## 2021-04-09 DIAGNOSIS — M5136 Other intervertebral disc degeneration, lumbar region: Secondary | ICD-10-CM | POA: Diagnosis not present

## 2021-04-09 DIAGNOSIS — M47816 Spondylosis without myelopathy or radiculopathy, lumbar region: Secondary | ICD-10-CM | POA: Diagnosis not present

## 2021-04-09 DIAGNOSIS — M48062 Spinal stenosis, lumbar region with neurogenic claudication: Secondary | ICD-10-CM | POA: Diagnosis not present

## 2021-04-09 DIAGNOSIS — M5416 Radiculopathy, lumbar region: Secondary | ICD-10-CM | POA: Diagnosis not present

## 2021-04-28 DIAGNOSIS — M47816 Spondylosis without myelopathy or radiculopathy, lumbar region: Secondary | ICD-10-CM | POA: Diagnosis not present

## 2021-05-15 DIAGNOSIS — M47816 Spondylosis without myelopathy or radiculopathy, lumbar region: Secondary | ICD-10-CM | POA: Diagnosis not present

## 2021-07-03 IMAGING — US US RENAL
1 series · 14 of 25 positions shown · non-contrast
Comparison: None.

CLINICAL DATA: Chronic kidney disease

EXAM:
RENAL / URINARY TRACT ULTRASOUND COMPLETE

[Series 1: us renal · 14 of 49 slices shown]
[im 1/49]
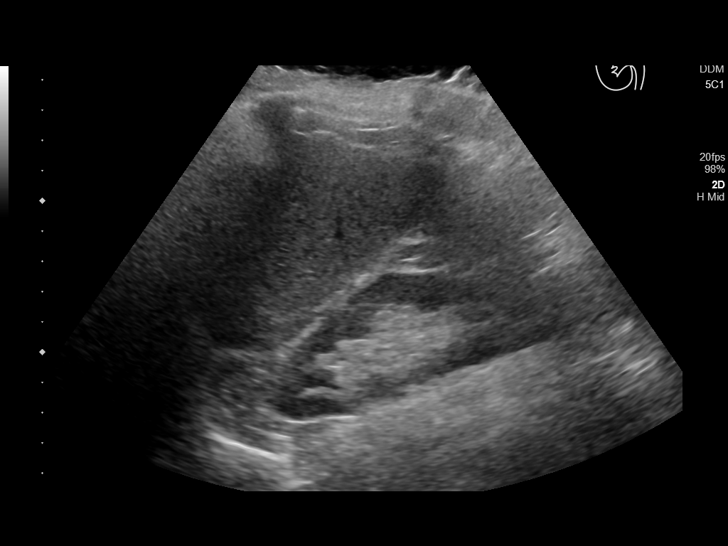
[im 5/49]
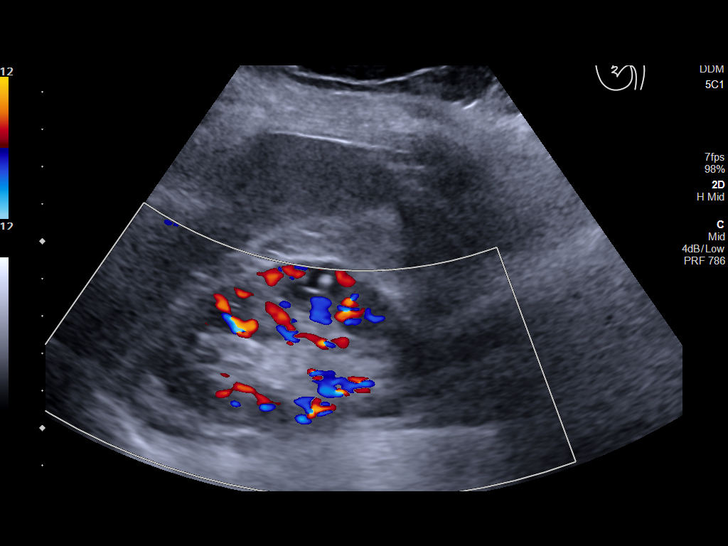
[im 9/49]
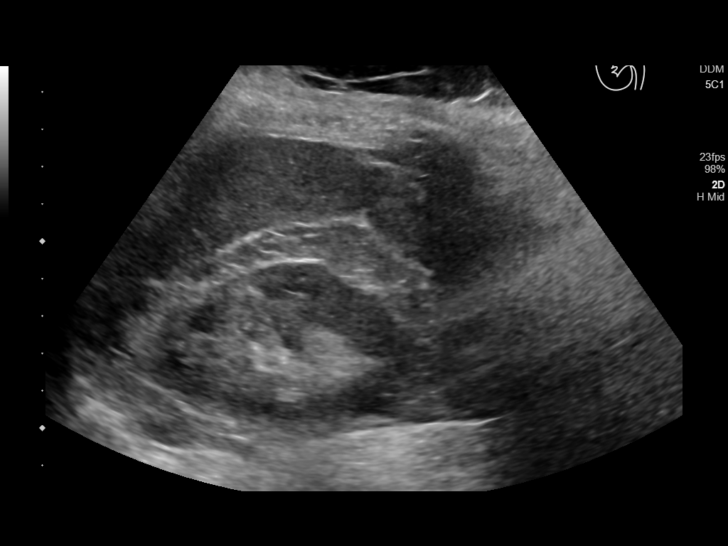
[im 13/49]
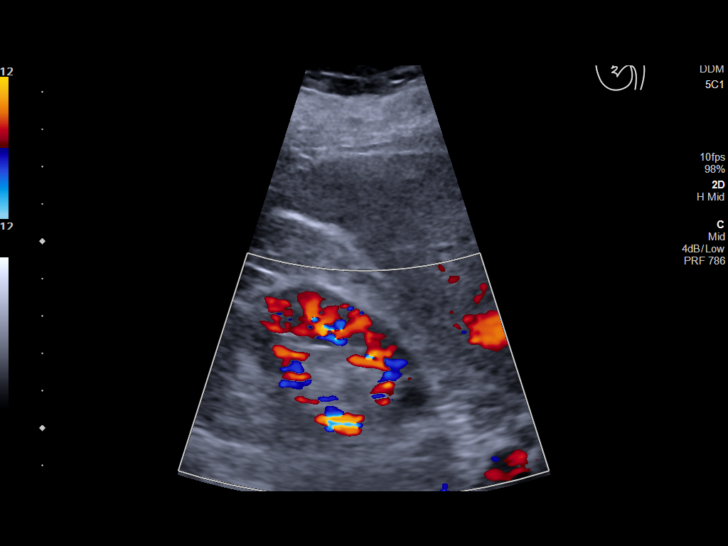
[im 17/49]
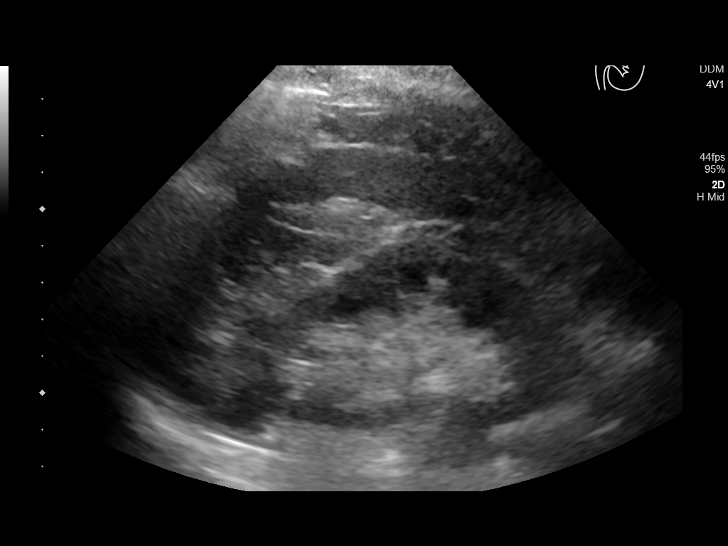
[im 19/49]
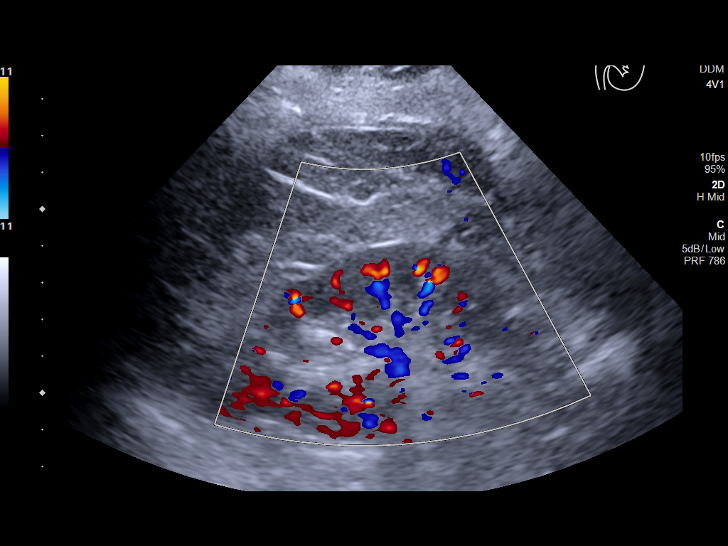
[im 23/49]
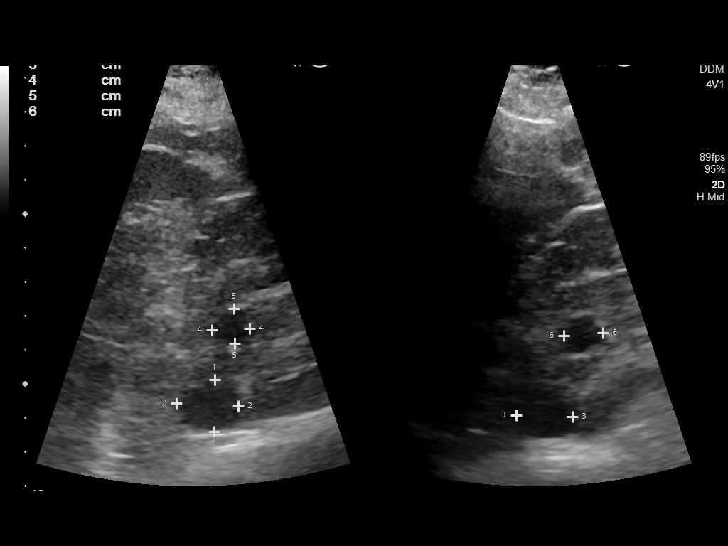
[im 27/49]
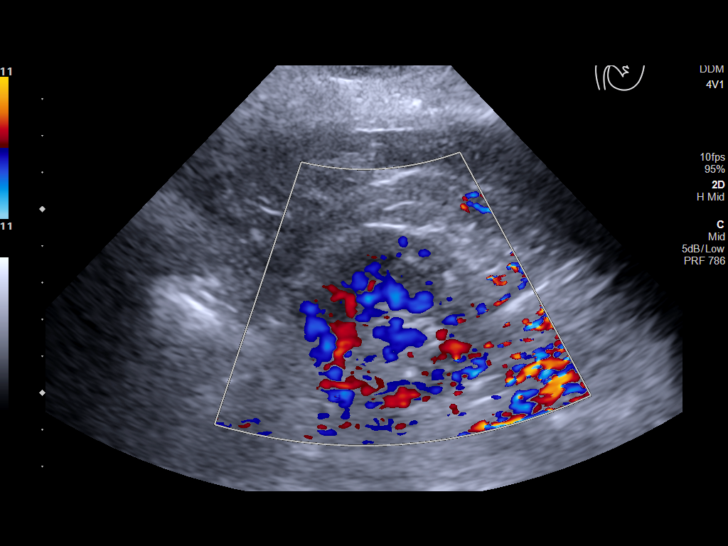
[im 31/49]
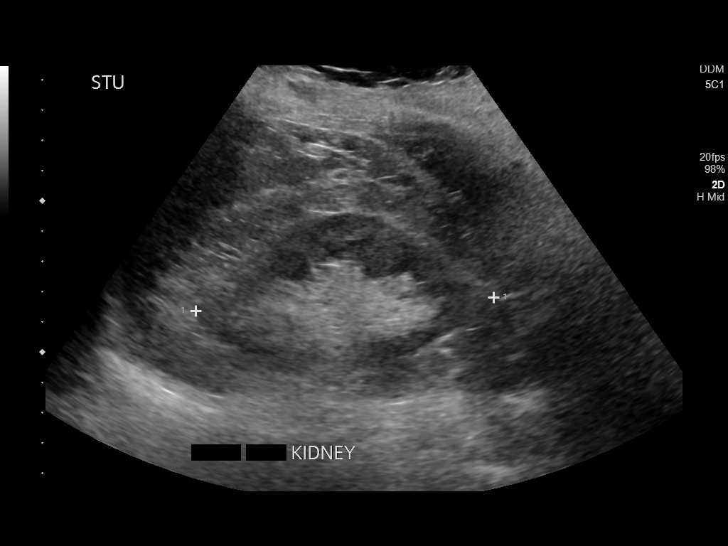
[im 33/49]
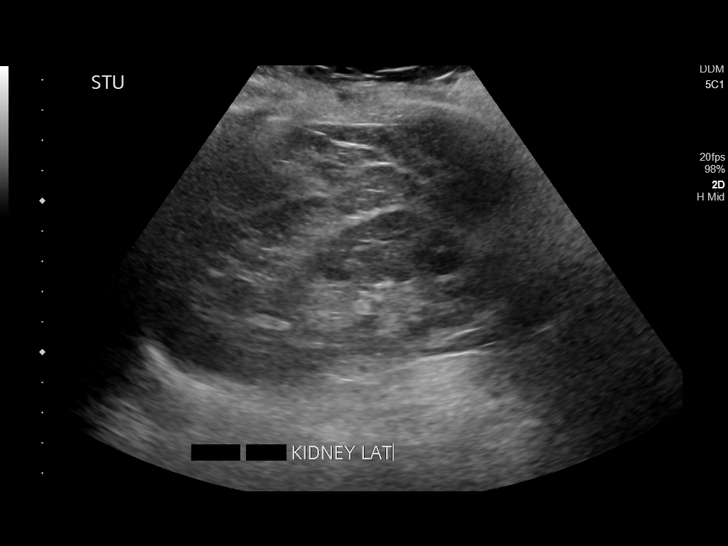
[im 37/49]
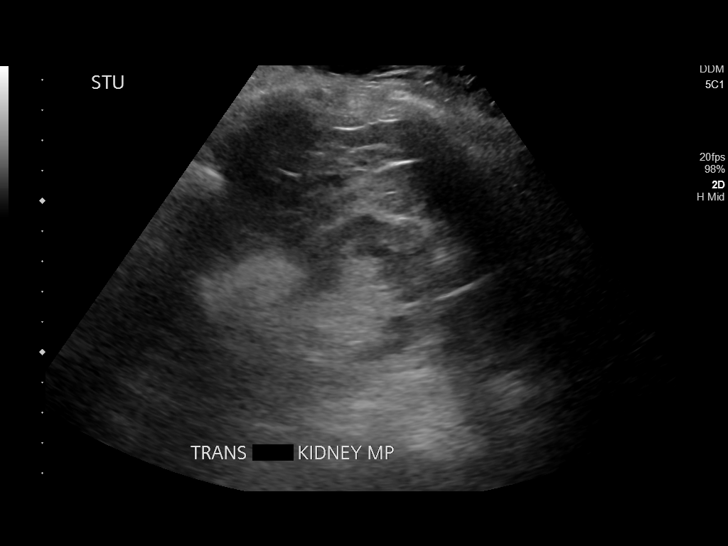
[im 41/49]
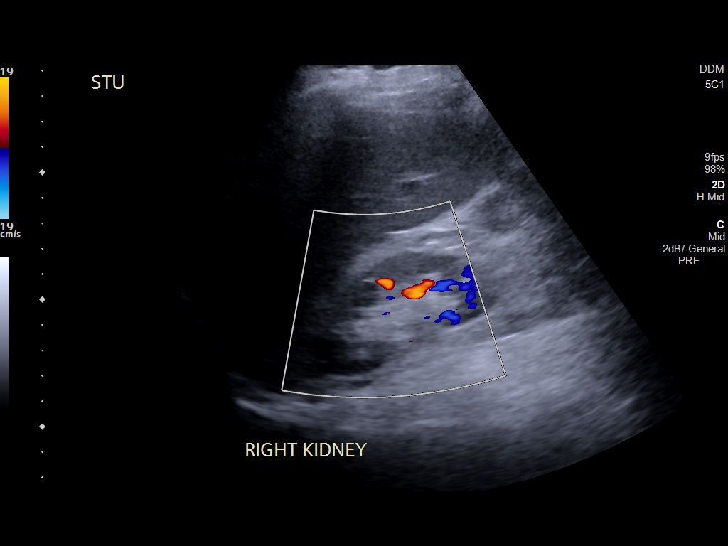
[im 45/49]
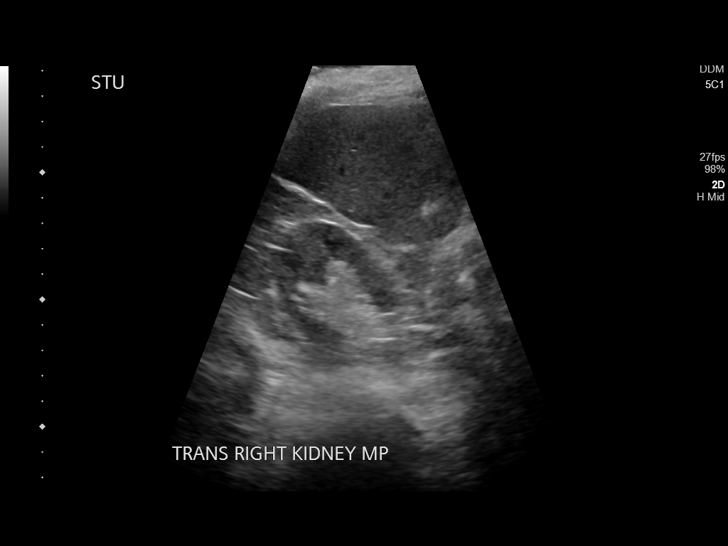
[im 49/49]
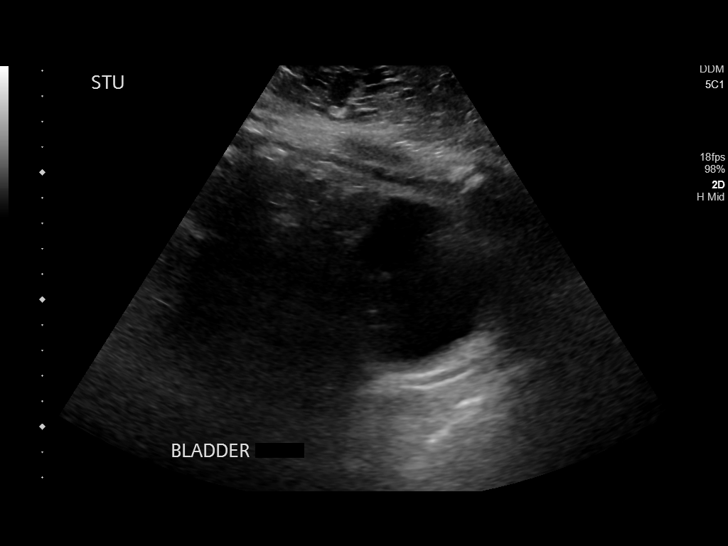

[14 of 25 positions shown; findings below may reference images not displayed]

FINDINGS: Right Kidney:

Renal measurements: 8.6 x 5 x 5 cm = volume: 114.1 mL. Increased
cortical echogenicity without hydronephrosis or mass

Left Kidney:

Renal measurements: 10 x 4.9 x 5 cm = volume: 126 mL. Increased
cortical echogenicity without hydronephrosis. There are small cysts
in the upper pole measuring 1.5 x 1.8 x 1.7 cm and 1.1 x 1 x 1.2 cm.

Bladder:

Appears normal for degree of bladder distention.

Other:

None.
IMPRESSION: 1. Increased cortical echogenicity consistent with medical renal
disease. No hydronephrosis.
2. Cysts, fewer than 10 within the left kidney

## 2021-07-09 DIAGNOSIS — E538 Deficiency of other specified B group vitamins: Secondary | ICD-10-CM | POA: Diagnosis not present

## 2021-07-09 DIAGNOSIS — J449 Chronic obstructive pulmonary disease, unspecified: Secondary | ICD-10-CM | POA: Diagnosis not present

## 2021-07-09 DIAGNOSIS — E785 Hyperlipidemia, unspecified: Secondary | ICD-10-CM | POA: Diagnosis not present

## 2021-07-09 DIAGNOSIS — I129 Hypertensive chronic kidney disease with stage 1 through stage 4 chronic kidney disease, or unspecified chronic kidney disease: Secondary | ICD-10-CM | POA: Diagnosis not present

## 2021-07-09 DIAGNOSIS — N189 Chronic kidney disease, unspecified: Secondary | ICD-10-CM | POA: Diagnosis not present

## 2021-07-09 DIAGNOSIS — Z0000 Encounter for general adult medical examination without abnormal findings: Secondary | ICD-10-CM | POA: Diagnosis not present

## 2021-07-15 DIAGNOSIS — M47816 Spondylosis without myelopathy or radiculopathy, lumbar region: Secondary | ICD-10-CM | POA: Diagnosis not present

## 2021-08-20 DIAGNOSIS — M5136 Other intervertebral disc degeneration, lumbar region: Secondary | ICD-10-CM | POA: Diagnosis not present

## 2021-08-20 DIAGNOSIS — M5416 Radiculopathy, lumbar region: Secondary | ICD-10-CM | POA: Diagnosis not present

## 2021-08-20 DIAGNOSIS — M48062 Spinal stenosis, lumbar region with neurogenic claudication: Secondary | ICD-10-CM | POA: Diagnosis not present

## 2021-10-05 DIAGNOSIS — I10 Essential (primary) hypertension: Secondary | ICD-10-CM | POA: Diagnosis not present

## 2021-10-05 DIAGNOSIS — Z79899 Other long term (current) drug therapy: Secondary | ICD-10-CM | POA: Diagnosis not present

## 2021-10-05 DIAGNOSIS — E782 Mixed hyperlipidemia: Secondary | ICD-10-CM | POA: Diagnosis not present

## 2021-10-12 DIAGNOSIS — J431 Panlobular emphysema: Secondary | ICD-10-CM | POA: Diagnosis not present

## 2021-10-12 DIAGNOSIS — E559 Vitamin D deficiency, unspecified: Secondary | ICD-10-CM | POA: Diagnosis not present

## 2021-10-12 DIAGNOSIS — E7800 Pure hypercholesterolemia, unspecified: Secondary | ICD-10-CM | POA: Diagnosis not present

## 2021-10-12 DIAGNOSIS — I129 Hypertensive chronic kidney disease with stage 1 through stage 4 chronic kidney disease, or unspecified chronic kidney disease: Secondary | ICD-10-CM | POA: Diagnosis not present

## 2021-10-12 DIAGNOSIS — Z1231 Encounter for screening mammogram for malignant neoplasm of breast: Secondary | ICD-10-CM | POA: Diagnosis not present

## 2021-10-12 DIAGNOSIS — N183 Chronic kidney disease, stage 3 unspecified: Secondary | ICD-10-CM | POA: Diagnosis not present

## 2021-10-19 DIAGNOSIS — Z961 Presence of intraocular lens: Secondary | ICD-10-CM | POA: Diagnosis not present

## 2021-10-19 DIAGNOSIS — H524 Presbyopia: Secondary | ICD-10-CM | POA: Diagnosis not present

## 2021-10-22 DIAGNOSIS — M5136 Other intervertebral disc degeneration, lumbar region: Secondary | ICD-10-CM | POA: Diagnosis not present

## 2021-10-22 DIAGNOSIS — M5416 Radiculopathy, lumbar region: Secondary | ICD-10-CM | POA: Diagnosis not present

## 2021-10-22 DIAGNOSIS — M48062 Spinal stenosis, lumbar region with neurogenic claudication: Secondary | ICD-10-CM | POA: Diagnosis not present

## 2021-10-22 DIAGNOSIS — G5602 Carpal tunnel syndrome, left upper limb: Secondary | ICD-10-CM | POA: Diagnosis not present

## 2021-10-27 ENCOUNTER — Other Ambulatory Visit: Payer: Self-pay | Admitting: Internal Medicine

## 2021-10-27 DIAGNOSIS — Z1231 Encounter for screening mammogram for malignant neoplasm of breast: Secondary | ICD-10-CM

## 2021-11-30 ENCOUNTER — Ambulatory Visit
Admission: RE | Admit: 2021-11-30 | Discharge: 2021-11-30 | Disposition: A | Discharge status: Home / Self Care | Payer: Medicare HMO | Source: Ambulatory Visit | Attending: Internal Medicine | Admitting: Internal Medicine

## 2021-11-30 ENCOUNTER — Other Ambulatory Visit: Payer: Self-pay

## 2021-11-30 DIAGNOSIS — Z1231 Encounter for screening mammogram for malignant neoplasm of breast: Secondary | ICD-10-CM | POA: Diagnosis not present

## 2022-01-07 DIAGNOSIS — G5602 Carpal tunnel syndrome, left upper limb: Secondary | ICD-10-CM | POA: Diagnosis not present

## 2022-01-07 DIAGNOSIS — M48062 Spinal stenosis, lumbar region with neurogenic claudication: Secondary | ICD-10-CM | POA: Diagnosis not present

## 2022-01-07 DIAGNOSIS — M5136 Other intervertebral disc degeneration, lumbar region: Secondary | ICD-10-CM | POA: Diagnosis not present

## 2022-01-07 DIAGNOSIS — M5416 Radiculopathy, lumbar region: Secondary | ICD-10-CM | POA: Diagnosis not present

## 2022-01-12 DIAGNOSIS — J449 Chronic obstructive pulmonary disease, unspecified: Secondary | ICD-10-CM | POA: Diagnosis not present

## 2022-01-12 DIAGNOSIS — E785 Hyperlipidemia, unspecified: Secondary | ICD-10-CM | POA: Diagnosis not present

## 2022-01-12 DIAGNOSIS — E538 Deficiency of other specified B group vitamins: Secondary | ICD-10-CM | POA: Diagnosis not present

## 2022-01-12 DIAGNOSIS — Z0000 Encounter for general adult medical examination without abnormal findings: Secondary | ICD-10-CM | POA: Diagnosis not present

## 2022-01-12 DIAGNOSIS — I129 Hypertensive chronic kidney disease with stage 1 through stage 4 chronic kidney disease, or unspecified chronic kidney disease: Secondary | ICD-10-CM | POA: Diagnosis not present

## 2022-01-12 DIAGNOSIS — Z1211 Encounter for screening for malignant neoplasm of colon: Secondary | ICD-10-CM | POA: Diagnosis not present

## 2022-01-12 DIAGNOSIS — N189 Chronic kidney disease, unspecified: Secondary | ICD-10-CM | POA: Diagnosis not present

## 2022-01-12 DIAGNOSIS — E559 Vitamin D deficiency, unspecified: Secondary | ICD-10-CM | POA: Diagnosis not present

## 2022-01-12 DIAGNOSIS — Z79899 Other long term (current) drug therapy: Secondary | ICD-10-CM | POA: Diagnosis not present

## 2022-04-12 DIAGNOSIS — R202 Paresthesia of skin: Secondary | ICD-10-CM | POA: Diagnosis not present

## 2022-04-12 DIAGNOSIS — R200 Anesthesia of skin: Secondary | ICD-10-CM | POA: Diagnosis not present

## 2022-04-15 DIAGNOSIS — M48062 Spinal stenosis, lumbar region with neurogenic claudication: Secondary | ICD-10-CM | POA: Diagnosis not present

## 2022-04-15 DIAGNOSIS — M5416 Radiculopathy, lumbar region: Secondary | ICD-10-CM | POA: Diagnosis not present

## 2022-04-15 DIAGNOSIS — M47816 Spondylosis without myelopathy or radiculopathy, lumbar region: Secondary | ICD-10-CM | POA: Diagnosis not present

## 2022-04-15 DIAGNOSIS — G5602 Carpal tunnel syndrome, left upper limb: Secondary | ICD-10-CM | POA: Diagnosis not present

## 2022-04-15 DIAGNOSIS — M79672 Pain in left foot: Secondary | ICD-10-CM | POA: Diagnosis not present

## 2022-04-22 DIAGNOSIS — E559 Vitamin D deficiency, unspecified: Secondary | ICD-10-CM | POA: Diagnosis not present

## 2022-04-22 DIAGNOSIS — I10 Essential (primary) hypertension: Secondary | ICD-10-CM | POA: Diagnosis not present

## 2022-04-22 DIAGNOSIS — E782 Mixed hyperlipidemia: Secondary | ICD-10-CM | POA: Diagnosis not present

## 2022-04-22 DIAGNOSIS — E538 Deficiency of other specified B group vitamins: Secondary | ICD-10-CM | POA: Diagnosis not present

## 2022-04-22 DIAGNOSIS — Z79899 Other long term (current) drug therapy: Secondary | ICD-10-CM | POA: Diagnosis not present

## 2022-04-29 DIAGNOSIS — E538 Deficiency of other specified B group vitamins: Secondary | ICD-10-CM | POA: Diagnosis not present

## 2022-04-29 DIAGNOSIS — Z1211 Encounter for screening for malignant neoplasm of colon: Secondary | ICD-10-CM | POA: Diagnosis not present

## 2022-04-29 DIAGNOSIS — J431 Panlobular emphysema: Secondary | ICD-10-CM | POA: Diagnosis not present

## 2022-04-29 DIAGNOSIS — E782 Mixed hyperlipidemia: Secondary | ICD-10-CM | POA: Diagnosis not present

## 2022-04-29 DIAGNOSIS — M79643 Pain in unspecified hand: Secondary | ICD-10-CM | POA: Diagnosis not present

## 2022-04-29 DIAGNOSIS — N183 Chronic kidney disease, stage 3 unspecified: Secondary | ICD-10-CM | POA: Diagnosis not present

## 2022-04-29 DIAGNOSIS — Z0000 Encounter for general adult medical examination without abnormal findings: Secondary | ICD-10-CM | POA: Diagnosis not present

## 2022-04-29 DIAGNOSIS — I129 Hypertensive chronic kidney disease with stage 1 through stage 4 chronic kidney disease, or unspecified chronic kidney disease: Secondary | ICD-10-CM | POA: Diagnosis not present

## 2022-04-29 DIAGNOSIS — E559 Vitamin D deficiency, unspecified: Secondary | ICD-10-CM | POA: Diagnosis not present

## 2022-05-04 DIAGNOSIS — N183 Chronic kidney disease, stage 3 unspecified: Secondary | ICD-10-CM | POA: Diagnosis not present

## 2022-05-04 DIAGNOSIS — Z1211 Encounter for screening for malignant neoplasm of colon: Secondary | ICD-10-CM | POA: Diagnosis not present

## 2022-05-04 DIAGNOSIS — M79672 Pain in left foot: Secondary | ICD-10-CM | POA: Diagnosis not present

## 2022-05-04 DIAGNOSIS — M722 Plantar fascial fibromatosis: Secondary | ICD-10-CM | POA: Diagnosis not present

## 2022-05-17 DIAGNOSIS — R200 Anesthesia of skin: Secondary | ICD-10-CM | POA: Diagnosis not present

## 2022-05-17 DIAGNOSIS — G5601 Carpal tunnel syndrome, right upper limb: Secondary | ICD-10-CM | POA: Diagnosis not present

## 2022-05-17 DIAGNOSIS — R202 Paresthesia of skin: Secondary | ICD-10-CM | POA: Diagnosis not present

## 2022-05-17 DIAGNOSIS — G5602 Carpal tunnel syndrome, left upper limb: Secondary | ICD-10-CM | POA: Diagnosis not present

## 2022-05-27 DIAGNOSIS — M79672 Pain in left foot: Secondary | ICD-10-CM | POA: Diagnosis not present

## 2022-05-27 DIAGNOSIS — N183 Chronic kidney disease, stage 3 unspecified: Secondary | ICD-10-CM | POA: Diagnosis not present

## 2022-05-27 DIAGNOSIS — M722 Plantar fascial fibromatosis: Secondary | ICD-10-CM | POA: Diagnosis not present

## 2022-07-23 DIAGNOSIS — I10 Essential (primary) hypertension: Secondary | ICD-10-CM | POA: Diagnosis not present

## 2022-07-30 DIAGNOSIS — E559 Vitamin D deficiency, unspecified: Secondary | ICD-10-CM | POA: Diagnosis not present

## 2022-07-30 DIAGNOSIS — Z87891 Personal history of nicotine dependence: Secondary | ICD-10-CM | POA: Diagnosis not present

## 2022-07-30 DIAGNOSIS — Z79899 Other long term (current) drug therapy: Secondary | ICD-10-CM | POA: Diagnosis not present

## 2022-07-30 DIAGNOSIS — N183 Chronic kidney disease, stage 3 unspecified: Secondary | ICD-10-CM | POA: Diagnosis not present

## 2022-07-30 DIAGNOSIS — J431 Panlobular emphysema: Secondary | ICD-10-CM | POA: Diagnosis not present

## 2022-07-30 DIAGNOSIS — E782 Mixed hyperlipidemia: Secondary | ICD-10-CM | POA: Diagnosis not present

## 2022-07-30 DIAGNOSIS — I129 Hypertensive chronic kidney disease with stage 1 through stage 4 chronic kidney disease, or unspecified chronic kidney disease: Secondary | ICD-10-CM | POA: Diagnosis not present

## 2022-10-21 DIAGNOSIS — H524 Presbyopia: Secondary | ICD-10-CM | POA: Diagnosis not present

## 2022-10-21 DIAGNOSIS — Z961 Presence of intraocular lens: Secondary | ICD-10-CM | POA: Diagnosis not present

## 2022-10-25 DIAGNOSIS — I10 Essential (primary) hypertension: Secondary | ICD-10-CM | POA: Diagnosis not present

## 2022-10-25 DIAGNOSIS — E782 Mixed hyperlipidemia: Secondary | ICD-10-CM | POA: Diagnosis not present

## 2022-10-25 DIAGNOSIS — Z79899 Other long term (current) drug therapy: Secondary | ICD-10-CM | POA: Diagnosis not present

## 2022-11-01 ENCOUNTER — Other Ambulatory Visit: Payer: Self-pay | Admitting: Internal Medicine

## 2022-11-01 DIAGNOSIS — M25561 Pain in right knee: Secondary | ICD-10-CM | POA: Diagnosis not present

## 2022-11-01 DIAGNOSIS — I129 Hypertensive chronic kidney disease with stage 1 through stage 4 chronic kidney disease, or unspecified chronic kidney disease: Secondary | ICD-10-CM | POA: Diagnosis not present

## 2022-11-01 DIAGNOSIS — Z1231 Encounter for screening mammogram for malignant neoplasm of breast: Secondary | ICD-10-CM

## 2022-11-01 DIAGNOSIS — E7800 Pure hypercholesterolemia, unspecified: Secondary | ICD-10-CM | POA: Diagnosis not present

## 2022-11-01 DIAGNOSIS — M25562 Pain in left knee: Secondary | ICD-10-CM | POA: Diagnosis not present

## 2022-11-01 DIAGNOSIS — N183 Chronic kidney disease, stage 3 unspecified: Secondary | ICD-10-CM | POA: Diagnosis not present

## 2022-11-01 DIAGNOSIS — J449 Chronic obstructive pulmonary disease, unspecified: Secondary | ICD-10-CM | POA: Diagnosis not present

## 2022-11-01 DIAGNOSIS — J431 Panlobular emphysema: Secondary | ICD-10-CM | POA: Diagnosis not present

## 2022-11-01 DIAGNOSIS — E559 Vitamin D deficiency, unspecified: Secondary | ICD-10-CM | POA: Diagnosis not present

## 2022-12-01 ENCOUNTER — Ambulatory Visit
Admission: RE | Admit: 2022-12-01 | Discharge: 2022-12-01 | Disposition: A | Discharge status: Home / Self Care | Payer: Medicare Other | Source: Ambulatory Visit | Attending: Internal Medicine | Admitting: Internal Medicine

## 2022-12-01 DIAGNOSIS — Z1231 Encounter for screening mammogram for malignant neoplasm of breast: Secondary | ICD-10-CM | POA: Insufficient documentation

## 2022-12-28 DIAGNOSIS — M1711 Unilateral primary osteoarthritis, right knee: Secondary | ICD-10-CM | POA: Diagnosis not present

## 2022-12-28 DIAGNOSIS — M1710 Unilateral primary osteoarthritis, unspecified knee: Secondary | ICD-10-CM | POA: Diagnosis not present

## 2022-12-28 DIAGNOSIS — M1712 Unilateral primary osteoarthritis, left knee: Secondary | ICD-10-CM | POA: Diagnosis not present

## 2023-01-22 NOTE — Discharge Instructions (Addendum)
Instructions after Hand / Wrist Surgery   James P. Hooten, Jr., M.D.  Dept. of Orthopaedics & Sports Medicine  [facility]  [address]  [place], [place]  [address]   Phone: [phone]   Fax: [phone]   DIET: Drink plenty of non-alcoholic fluids & begin a light diet. Resume your normal diet the day after surgery.  ACTIVITY:  Keep the hand elevated above the level of the elbow. Begin gently moving the fingers on a regular basis to avoid stiffness. Avoid any heavy lifting, pushing, or pulling with the operative hand. Do not drive or operate any equipment until instructed.  WOUND CARE:  Keep the splint/bandage clean and dry.  The splint and stitches will be removed in the office. Continue to use the ice packs periodically to reduce pain and swelling. You may bathe or shower after the stitches are removed at the first office visit following surgery.  MEDICATIONS: You may resume your regular medications. Please take the pain medication as prescribed. Do not take pain medication on an empty stomach. Do not drive or drink alcoholic beverages when taking pain medications.  CALL THE OFFICE FOR: Temperature above 101 degrees Excessive bleeding or drainage on the dressing. Excessive swelling, coldness, or paleness of the fingers. Persistent nausea and vomiting.  FOLLOW-UP:  You should have an appointment to return to the office in 7-10 days after surgery.   REMEMBER: R.I.C.E. = Rest, Ice, Compression, Elevation !      [facility] Department Directory         [email or web address]       [email or web address]          Cardiology  Appointments: [place] - [phone] [place] - [phone]  Endocrinology  Appointments: [place] - [phone] [place] - [phone]  Gastroenterology  Appointments: [place] - [phone] [place] - [phone]        General Surgery   Appointments: [place] -  [phone]  Internal Medicine/Family Medicine  Appointments: [place] - [phone] [place] - [phone] [place] - [phone]  Metabolic and Weigh Loss Surgery  Appointments: [place] - [phone]        Neurology  Appointments: [place] - [phone] [place] - [phone]  Neurosurgery  Appointments: [place] - [phone]  Obstetrics & Gynecology  Appointments: [place] - [phone] [place] - [phone]        Pediatrics  Appointments: [place] - [phone] [place] - [phone]  Physiatry  Appointments: [place] -[phone]  Physical Therapy  Appointments: [place] - [phone] [place] - [phone]        Podiatry  Appointments: [place] - [phone] [place] - [phone]  Pulmonology  Appointments: [place] - [phone]  Rheumatology  Appointments: [place] - [phone]        [place] Location: [facility]  [address] [place], [place]  [address]  [place] Location: [facility] [address] [place], [place]  [address]  [place] Location: [facility] [address] [place], [place]  [address]            AMBULATORY SURGERY  DISCHARGE INSTRUCTIONS   The drugs that you were given will stay in your system until tomorrow so for the next 24 hours you should not:  Drive an automobile Make any legal decisions Drink any alcoholic beverage   You may resume regular meals tomorrow.  Today it is better to start with liquids and gradually work up to solid foods.  You may eat anything you prefer, but it is better to start with liquids, then soup and crackers, and gradually work up to solid foods.     Please notify your doctor immediately if you have any unusual bleeding, trouble breathing, redness and pain at the surgery site, drainage, fever, or pain not relieved by medication.    Additional Instructions:   Please contact your physician with any problems or Same Day Surgery at [phone],  Monday through Friday 6 am to 4 pm, or [place] [facility] [place] Main number at [phone]. 

## 2023-01-26 ENCOUNTER — Encounter
Admission: RE | Admit: 2023-01-26 | Discharge: 2023-01-26 | Disposition: A | Discharge status: Home / Self Care | Payer: Medicare HMO | Source: Ambulatory Visit | Attending: Orthopedic Surgery | Admitting: Orthopedic Surgery

## 2023-01-26 VITALS — Ht 61.0 in | Wt 170.0 lb

## 2023-01-26 DIAGNOSIS — Z01812 Encounter for preprocedural laboratory examination: Secondary | ICD-10-CM

## 2023-01-26 DIAGNOSIS — I10 Essential (primary) hypertension: Secondary | ICD-10-CM

## 2023-01-26 HISTORY — DX: Personal history of urinary calculi: Z87.442

## 2023-01-26 HISTORY — DX: Deficiency of other specified B group vitamins: E53.8

## 2023-01-26 HISTORY — DX: Osteomyelitis, unspecified: M86.9

## 2023-01-26 HISTORY — DX: Vitamin D deficiency, unspecified: E55.9

## 2023-01-26 HISTORY — DX: Age-related osteoporosis without current pathological fracture: M81.0

## 2023-01-26 HISTORY — DX: Encephalitis and encephalomyelitis, unspecified: G04.90

## 2023-01-26 HISTORY — DX: Dysphagia, unspecified: R13.10

## 2023-01-26 NOTE — Patient Instructions (Addendum)
Your procedure is scheduled on: Monday, March 11 Report to the Registration Desk on the 1st floor of the [facility]. To find out your arrival time, please call [phone] between 1PM - 3PM on: Friday, March 8 If your arrival time is 6:00 am, do not arrive before that time as the [place] entrance doors do not open until 6:00 am.  REMEMBER: Instructions that are not followed completely may result in serious medical risk, up to and including death; or upon the discretion of your surgeon and anesthesiologist your surgery may need to be rescheduled.  Do not eat food after midnight the night before surgery.  No gum chewing or hard candies.  You may however, drink CLEAR liquids up to 2 hours before you are scheduled to arrive for your surgery. Do not drink anything within 2 hours of your scheduled arrival time.  Clear liquids include: - water  - apple juice without pulp - gatorade (not RED colors) - black coffee or tea (Do NOT add milk or creamers to the coffee or tea) Do NOT drink anything that is not on this list.  In addition, your doctor has ordered for you to drink the provided:  Ensure Pre-Surgery Clear Carbohydrate Drink  Drinking this carbohydrate drink up to two hours before surgery helps to reduce insulin resistance and improve patient outcomes. Please complete drinking 2 hours before scheduled arrival time.  One week prior to surgery: starting today, March 6 Stop aspirin and Anti-inflammatories (NSAIDS) such as Advil, Aleve, Ibuprofen, Motrin, Naproxen, Naprosyn and Aspirin based products such as Excedrin, Goody's Powder, BC Powder. Stop ANY OVER THE COUNTER supplements until after surgery. You may however, continue to take Tylenol if needed for pain up until the day of surgery.  Continue taking all prescribed medications  TAKE ONLY THESE MEDICATIONS THE MORNING OF SURGERY WITH A SIP OF WATER:  Amlodipine Advair inhaler Metoprolol Omeprazole (Prilosec) - (take one  the night before and one on the morning of surgery - helps to prevent nausea after surgery.) Rosuvastatin (Crestor) Tiotropium respimat inhaler  No Alcohol for 24 hours before or after surgery.  No Smoking including e-cigarettes for 24 hours before surgery.  No chewable tobacco products for at least 6 hours before surgery.  No nicotine patches on the day of surgery.  Do not use any "recreational" drugs for at least a week (preferably 2 weeks) before your surgery.  Please be advised that the combination of cocaine and anesthesia may have negative outcomes, up to and including death. If you test positive for cocaine, your surgery will be cancelled.  On the morning of surgery brush your teeth with toothpaste and water, you may rinse your mouth with mouthwash if you wish. Do not swallow any toothpaste or mouthwash.  Use CHG Soap as directed on instruction sheet.  Do not wear jewelry, make-up, hairpins, clips or nail polish.  Do not wear lotions, powders, or perfumes.   Do not shave body hair from the neck down 48 hours before surgery.  Contact lenses, hearing aids and dentures may not be worn into surgery.  Do not bring valuables to the hospital. [facility] is not responsible for any missing/lost belongings or valuables.   Notify your doctor if there is any change in your medical condition (cold, fever, infection).  Wear comfortable clothing (specific to your surgery type) to the hospital.  After surgery, you can help prevent lung complications by doing breathing exercises.  Take deep breaths and cough every 1-2 hours. Your  doctor may order a device called an Incentive Spirometer to help you take deep breaths.  If you are being discharged the day of surgery, you will not be allowed to drive home. You will need a responsible individual to drive you home and stay with you for 24 hours after surgery.   If you are taking public transportation, you will need to have a responsible  individual with you.  Please call the [place] Dept. at [phone] if you have any questions about these instructions.  Surgery Visitation Policy:  Patients undergoing a surgery or procedure may have two family members or support persons with them as long as the person is not COVID-19 positive or experiencing its symptoms.      Preparing for Surgery with CHLORHEXIDINE GLUCONATE (CHG) Soap  Chlorhexidine Gluconate (CHG) Soap  o An antiseptic cleaner that kills germs and bonds with the skin to continue killing germs even after washing  o Used for showering the night before surgery and morning of surgery  Before surgery, you can play an important role by reducing the number of germs on your skin.  CHG (Chlorhexidine gluconate) soap is an antiseptic cleanser which kills germs and bonds with the skin to continue killing germs even after washing.  Please do not use if you have an allergy to CHG or antibacterial soaps. If your skin becomes reddened/irritated stop using the CHG.  1. Shower the NIGHT BEFORE SURGERY and the MORNING OF SURGERY with CHG soap.  2. If you choose to wash your hair, wash your hair first as usual with your normal shampoo.  3. After shampooing, rinse your hair and body thoroughly to remove the shampoo.  4. Use CHG as you would any other liquid soap. You can apply CHG directly to the skin and wash gently with a scrungie or a clean washcloth.  5. Apply the CHG soap to your body only from the neck down. Do not use on open wounds or open sores. Avoid contact with your eyes, ears, mouth, and genitals (private parts). Wash face and genitals (private parts) with your normal soap.  6. Wash thoroughly, paying special attention to the area where your surgery will be performed.  7. Thoroughly rinse your body with warm water.  8. Do not shower/wash with your normal soap after using and rinsing off the CHG soap.  9. Pat yourself dry with a clean  towel.  10. Wear clean pajamas to bed the night before surgery.  12. Place clean sheets on your bed the night of your first shower and do not sleep with pets.  13. Shower again with the CHG soap on the day of surgery prior to arriving at the hospital.  14. Do not apply any deodorants/lotions/powders.  15. Please wear clean clothes to the hospital.

## 2023-01-27 ENCOUNTER — Encounter
Admission: RE | Admit: 2023-01-27 | Discharge: 2023-01-27 | Disposition: A | Discharge status: Home / Self Care | Payer: Medicare HMO | Source: Ambulatory Visit | Attending: Orthopedic Surgery | Admitting: Orthopedic Surgery

## 2023-01-27 DIAGNOSIS — Z01818 Encounter for other preprocedural examination: Secondary | ICD-10-CM | POA: Insufficient documentation

## 2023-01-27 DIAGNOSIS — Z01810 Encounter for preprocedural cardiovascular examination: Secondary | ICD-10-CM | POA: Diagnosis not present

## 2023-01-27 DIAGNOSIS — Z01812 Encounter for preprocedural laboratory examination: Secondary | ICD-10-CM

## 2023-01-27 DIAGNOSIS — I10 Essential (primary) hypertension: Secondary | ICD-10-CM | POA: Insufficient documentation

## 2023-01-27 DIAGNOSIS — J449 Chronic obstructive pulmonary disease, unspecified: Secondary | ICD-10-CM | POA: Insufficient documentation

## 2023-01-27 LAB — BASIC METABOLIC PANEL
Anion gap: 10 (ref 5–15)
BUN: 9 mg/dL (ref 8–23)
CO2: 21 mmol/L — ABNORMAL LOW (ref 22–32)
Calcium: 9.1 mg/dL (ref 8.9–10.3)
Chloride: 106 mmol/L (ref 98–111)
Creatinine, Ser: 1.22 mg/dL — ABNORMAL HIGH (ref 0.44–1.00)
GFR, Estimated: 44 mL/min — ABNORMAL LOW (ref >60)
Glucose, Bld: 95 mg/dL (ref 70–99)
Potassium: 4.3 mmol/L (ref 3.5–5.1)
Sodium: 137 mmol/L (ref 135–145)

## 2023-01-27 LAB — CBC
HCT: 36.9 % (ref 36.0–46.0)
Hemoglobin: 12.3 g/dL (ref 12.0–15.0)
MCH: 29.6 pg (ref 26.0–34.0)
MCHC: 33.3 g/dL (ref 30.0–36.0)
MCV: 88.7 fL (ref 80.0–100.0)
Platelets: 298 10*3/uL (ref 150–400)
RBC: 4.16 MIL/uL (ref 3.87–5.11)
RDW: 14.1 % (ref 11.5–15.5)
WBC: 7.4 10*3/uL (ref 4.0–10.5)
nRBC: 0 % (ref 0.0–0.2)

## 2023-01-28 DIAGNOSIS — G5602 Carpal tunnel syndrome, left upper limb: Secondary | ICD-10-CM | POA: Diagnosis not present

## 2023-01-30 ENCOUNTER — Encounter: Payer: Self-pay | Admitting: Orthopedic Surgery

## 2023-01-30 NOTE — H&P (Signed)
ORTHOPAEDIC HISTORY & PHYSICAL Katherine Mosley, Florinda Marker., MD - 01/28/2023 12:30 PM EST Formatting of this note is different from the original. Images from the original note were not included. Chief Complaint: Chief Complaint Patient presents with Left carpal tunnel syndrome  Reason for Visit: The patient is a 82 y.o. right-hand dominant female who presents today for reevaluation of her left hand and wrist. She reported a history of pain, numbness, and tingling to the left hand. The symptoms were localized to the thumb, index, and long fingers. She also reports difficulty with buttoning buttons and picking up small items. The paresthesias tend to be worse at night. She has not appreciated any significant improvement despite use of Tylenol and wrist splints.  Medications: Current Outpatient Medications Medication Sig Dispense Refill acetaminophen (TYLENOL) 500 MG tablet Take by mouth amLODIPine (NORVASC) 5 MG tablet TAKE 1 TABLET EVERY DAY 90 tablet 3 aspirin 81 MG EC tablet Take 81 mg by mouth once daily. cholecalciferol (VITAMIN D3) 1,000 unit capsule Take 1,000 Units by mouth once daily cyanocobalamin (VITAMIN B12) 1000 MCG tablet Take 1,000 mcg by mouth once daily fluticasone propion-salmeteroL (ADVAIR DISKUS) 250-50 mcg/dose diskus inhaler INHALE 1 PUFF EVERY 12 HOURS 180 each 3 loratadine (CLARITIN) 10 mg tablet Take 10 mg by mouth once daily. losartan (COZAAR) 100 MG tablet TAKE 1 TABLET EVERY DAY (NEED MD APPOINTMENT) 90 tablet 3 metoprolol tartrate (LOPRESSOR) 50 MG tablet Take 1 tablet (50 mg total) by mouth 2 (two) times daily 180 tablet 1 mirtazapine (REMERON) 15 MG tablet Take 1 tablet (15 mg total) by mouth at bedtime 90 tablet 3 omeprazole (PRILOSEC) 40 MG DR capsule TAKE 1 CAPSULE ONE TIME DAILY 90 capsule 3 peg 400-propylene glycol, PF, (SYSTANE ULTRA) 0.4-0.3 % ophthalmic drops Place 1 drop into both eyes as directed for Dry Eyes. rosuvastatin (CRESTOR) 10 MG tablet TAKE 1  TABLET EVERY DAY 90 tablet 3 tiotropium bromide (SPIRIVA RESPIMAT) 2.5 mcg/actuation inhalation spray Inhale 2 inhalations (5 mcg total) into the lungs once daily 12 g 3  No current facility-administered medications for this visit.  Allergies: Allergies Allergen Reactions Prednisone Unknown Pt does not remember, was not anaphylaxis Sulfa (Sulfonamide Antibiotics) Unknown Pt does not remember, was not anaphylaxis Zoloft [Sertraline] Vomiting  Past Medical History: Past Medical History: Diagnosis Date COPD (chronic obstructive pulmonary disease) (CMS-HCC) Depression Dysphagia Environmental allergies Hyperlipidemia Hypertension Osteoarthritis a. Hands b. 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, [employer] Tobacco Use Smoking status: Former Packs/day: 1 Types: Cigarettes Quit date: 03/15/2018 Years since quitting: 4.8 Smokeless tobacco: Never Vaping Use Vaping Use: Never used Substance and Sexual Activity Alcohol use: No Alcohol/week: 0.0 standard drinks of alcohol Sexual activity: Not Currently Partners: Male  Family History: Family History Problem Relation Age of Onset Dementia Mother High blood pressure (Hypertension) Mother Stroke Mother Kidney disease Father High blood pressure (Hypertension) Brother Heart disease Brother Cancer Brother Kidney disease Brother Arthritis Other Kidney disease Other Gout Other Stroke Other High blood pressure (Hypertension) Brother Ovarian cancer Sister Cancer Sister Stroke Sister Coronary  Artery Disease (Blocked arteries around heart) Son Seizures Neg Hx  Review of Systems: A comprehensive 14 point ROS was performed, reviewed, and the pertinent orthopaedic findings are  documented in the HPI.  Exam BP 118/74  Ht 152.4 cm (5')  Wt 79.8 kg (176 lb)  LMP (LMP Unknown) Comment: Hysterectomy  BMI 34.37 kg/m  General: Well-developed, well-nourished female seen in no acute distress.  HEENT: Atraumatic, normocephalic. Pupils are equal and reactive to light. Extraocular motion is intact. Sclera are clear. Oropharynx is clear with moist mucosa.  Neck: Good range of motion. No tenderness to palpation. Spurling`s test is negative.  Lungs: Clear to auscultation bilaterally.  Cardiovascular: Regular rate and rhythm. Normal S1, S2. No murmur . No appreciable gallops or rubs. Peripheral pulses are palpable.  Extremities: Normal shoulder contour. Good range of motion and stability of the shoulders, elbows, and wrists. Tinel`s test at the elbow is negative.  Left hand: Tenderness: Negative Erythema: negative Swelling: negative Capillary Refill: normal Thenar atrophy: negative Intrinsic wasting: negative Grip strength: fair to good grip strength Pincer strength: fair to good pincer strength Tinel`s test: positive Phalen`s test: equivocal Triggering: No gross triggering or locking of the digits Finkelstein`s test: negative Range of motion: Good range of motion of the digits  Neurologic: Awake, alert , and oriented. Sensory function is intact except for decreased discrimination to pinprick and light touch in a left median nerve distribution. Motor strength is judged to be 5/5 except as noted above. No clonus or tremor. Motor coordination is within normal limits.  Nerve conduction studies: EMG/nerve conduction studies performed by Dr. Jennings Books on 04/15/2022 were reviewed. 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 Findings are consistent with moderate degree of involvement. There is no evidence of focal peripheral neuropathy involving either ulnar nerve.  Impression: Left carpal tunnel syndrome  Plan: The findings were discussed in detail with the patient. The patient was given informational material on carpal tunnel release. Conservative treatment options were reviewed with the patient. We discussed the risks and benefits of surgical intervention. The usual perioperative course was also discussed in detail. The patient expressed understanding of the risks and benefits of surgical intervention and would like to proceed with plans for left carpal tunnel release.  I spent a total of 40 minutes in both face-to-face and non-face-to-face activities, excluding procedures performed, for this visit on the date of this encounter.  MEDICAL CLEARANCE: Per anesthesiology ACTIVITIES: As tolerated. WORK STATUS: Not applicable. THERAPY: None MEDICATIONS: Requested Prescriptions  No prescriptions requested or ordered in this encounter  FOLLOW-UP: Return for postoperative follow-up.  Gennifer Potenza P. Holley Bouche., M.D.  Electronically signed by Lamar Benes., MD at 01/29/2023 5:03 PM EST

## 2023-01-31 ENCOUNTER — Other Ambulatory Visit: Payer: Self-pay

## 2023-01-31 ENCOUNTER — Ambulatory Visit
Admission: RE | Admit: 2023-01-31 | Discharge: 2023-01-31 | Disposition: A | Discharge status: Home / Self Care | Payer: Medicare HMO | Source: Ambulatory Visit | Attending: Orthopedic Surgery | Admitting: Orthopedic Surgery

## 2023-01-31 ENCOUNTER — Ambulatory Visit: Payer: Medicare HMO | Admitting: Anesthesiology

## 2023-01-31 ENCOUNTER — Encounter: Payer: Self-pay | Admitting: Orthopedic Surgery

## 2023-01-31 ENCOUNTER — Encounter
Admission: RE | Disposition: A | Discharge status: Home / Self Care | Payer: Self-pay | Source: Ambulatory Visit | Attending: Orthopedic Surgery

## 2023-01-31 ENCOUNTER — Ambulatory Visit: Payer: Medicare HMO | Admitting: Urgent Care

## 2023-01-31 DIAGNOSIS — J449 Chronic obstructive pulmonary disease, unspecified: Secondary | ICD-10-CM | POA: Diagnosis not present

## 2023-01-31 DIAGNOSIS — I10 Essential (primary) hypertension: Secondary | ICD-10-CM | POA: Insufficient documentation

## 2023-01-31 DIAGNOSIS — Z8249 Family history of ischemic heart disease and other diseases of the circulatory system: Secondary | ICD-10-CM | POA: Insufficient documentation

## 2023-01-31 DIAGNOSIS — K219 Gastro-esophageal reflux disease without esophagitis: Secondary | ICD-10-CM | POA: Insufficient documentation

## 2023-01-31 DIAGNOSIS — N183 Chronic kidney disease, stage 3 unspecified: Secondary | ICD-10-CM | POA: Diagnosis not present

## 2023-01-31 DIAGNOSIS — I129 Hypertensive chronic kidney disease with stage 1 through stage 4 chronic kidney disease, or unspecified chronic kidney disease: Secondary | ICD-10-CM | POA: Diagnosis not present

## 2023-01-31 DIAGNOSIS — Z6832 Body mass index (BMI) 32.0-32.9, adult: Secondary | ICD-10-CM | POA: Insufficient documentation

## 2023-01-31 DIAGNOSIS — G5602 Carpal tunnel syndrome, left upper limb: Secondary | ICD-10-CM | POA: Insufficient documentation

## 2023-01-31 DIAGNOSIS — Z9889 Other specified postprocedural states: Secondary | ICD-10-CM

## 2023-01-31 DIAGNOSIS — E669 Obesity, unspecified: Secondary | ICD-10-CM | POA: Insufficient documentation

## 2023-01-31 DIAGNOSIS — Z87891 Personal history of nicotine dependence: Secondary | ICD-10-CM | POA: Diagnosis not present

## 2023-01-31 HISTORY — PX: CARPAL TUNNEL RELEASE: SHX101

## 2023-01-31 SURGERY — CARPAL TUNNEL RELEASE
Anesthesia: General | Site: Wrist | Laterality: Left

## 2023-01-31 MED ORDER — FENTANYL CITRATE (PF) 100 MCG/2ML IJ SOLN: 25.0000 ug | INTRAMUSCULAR | Status: DC | PRN

## 2023-01-31 MED ORDER — ACETAMINOPHEN 10 MG/ML IV SOLN
INTRAVENOUS | Status: DC | PRN
  Administered 2023-01-31: 1000 mg via INTRAVENOUS

## 2023-01-31 MED ORDER — TRAMADOL HCL 50 MG PO TABS: 50.0000 mg | ORAL_TABLET | Freq: Four times a day (QID) | ORAL | 0 refills | Status: AC | PRN

## 2023-01-31 MED ORDER — PROPOFOL 500 MG/50ML IV EMUL
INTRAVENOUS | Status: DC | PRN
  Administered 2023-01-31: 75 ug/kg/min via INTRAVENOUS

## 2023-01-31 MED ORDER — ACETAMINOPHEN 10 MG/ML IV SOLN
INTRAVENOUS | Status: AC
  Filled 2023-01-31: qty 100

## 2023-01-31 MED ORDER — ONDANSETRON HCL 4 MG/2ML IJ SOLN
INTRAMUSCULAR | Status: DC | PRN
  Administered 2023-01-31: 4 mg via INTRAVENOUS

## 2023-01-31 MED ORDER — 0.9 % SODIUM CHLORIDE (POUR BTL) OPTIME
TOPICAL | Status: DC | PRN
  Administered 2023-01-31: 500 mL

## 2023-01-31 MED ORDER — CHLORHEXIDINE GLUCONATE 0.12 % MT SOLN: 15.0000 mL | Freq: Once | OROMUCOSAL | Status: AC

## 2023-01-31 MED ORDER — OXYCODONE HCL 5 MG/5ML PO SOLN: 5.0000 mg | Freq: Once | ORAL | Status: DC | PRN

## 2023-01-31 MED ORDER — PROPOFOL 10 MG/ML IV BOLUS
INTRAVENOUS | Status: DC | PRN
  Administered 2023-01-31: 100 mg via INTRAVENOUS
  Administered 2023-01-31: 40 mg via INTRAVENOUS

## 2023-01-31 MED ORDER — BUPIVACAINE HCL (PF) 0.25 % IJ SOLN
INTRAMUSCULAR | Status: AC
  Filled 2023-01-31: qty 30

## 2023-01-31 MED ORDER — BUPIVACAINE HCL (PF) 0.25 % IJ SOLN
INTRAMUSCULAR | Status: DC | PRN
  Administered 2023-01-31: 10 mL

## 2023-01-31 MED ORDER — LACTATED RINGERS IV SOLN: INTRAVENOUS | Status: DC

## 2023-01-31 MED ORDER — CELECOXIB 200 MG PO CAPS
ORAL_CAPSULE | ORAL | Status: AC
  Administered 2023-01-31: 400 mg via ORAL
  Filled 2023-01-31: qty 2

## 2023-01-31 MED ORDER — FENTANYL CITRATE (PF) 100 MCG/2ML IJ SOLN
INTRAMUSCULAR | Status: AC
  Filled 2023-01-31: qty 2

## 2023-01-31 MED ORDER — ORAL CARE MOUTH RINSE: 15.0000 mL | Freq: Once | OROMUCOSAL | Status: AC

## 2023-01-31 MED ORDER — DEXAMETHASONE SODIUM PHOSPHATE 10 MG/ML IJ SOLN
INTRAMUSCULAR | Status: DC | PRN
  Administered 2023-01-31: 5 mg via INTRAVENOUS

## 2023-01-31 MED ORDER — CELECOXIB 200 MG PO CAPS: 400.0000 mg | ORAL_CAPSULE | Freq: Once | ORAL | Status: AC

## 2023-01-31 MED ORDER — CHLORHEXIDINE GLUCONATE 0.12 % MT SOLN
OROMUCOSAL | Status: AC
  Administered 2023-01-31: 15 mL via OROMUCOSAL
  Filled 2023-01-31: qty 15

## 2023-01-31 MED ORDER — ONDANSETRON HCL 4 MG/2ML IJ SOLN: 4.0000 mg | Freq: Once | INTRAMUSCULAR | Status: DC | PRN

## 2023-01-31 MED ORDER — OXYCODONE HCL 5 MG PO TABS: 5.0000 mg | ORAL_TABLET | Freq: Once | ORAL | Status: DC | PRN

## 2023-01-31 MED ORDER — FENTANYL CITRATE (PF) 100 MCG/2ML IJ SOLN
INTRAMUSCULAR | Status: DC | PRN
  Administered 2023-01-31 (×2): 50 ug via INTRAVENOUS

## 2023-01-31 MED ORDER — PROPOFOL 1000 MG/100ML IV EMUL
INTRAVENOUS | Status: AC
  Filled 2023-01-31: qty 100

## 2023-01-31 MED ORDER — ACETAMINOPHEN 10 MG/ML IV SOLN: 1000.0000 mg | Freq: Once | INTRAVENOUS | Status: DC | PRN

## 2023-01-31 MED ORDER — NEOMYCIN-POLYMYXIN B GU 40-200000 IR SOLN
Status: DC | PRN
  Administered 2023-01-31: 2 mL

## 2023-01-31 MED ORDER — LIDOCAINE HCL (CARDIAC) PF 100 MG/5ML IV SOSY
PREFILLED_SYRINGE | INTRAVENOUS | Status: DC | PRN
  Administered 2023-01-31: 80 mg via INTRAVENOUS

## 2023-01-31 SURGICAL SUPPLY — 33 items
BNDG CMPR STD VLCR NS LF 5.8X3 (GAUZE/BANDAGES/DRESSINGS) ×1
BNDG ELASTIC 3X5.8 VLCR NS LF (GAUZE/BANDAGES/DRESSINGS) ×1 IMPLANT
BNDG ELASTIC 3X5.8 VLCR STR LF (GAUZE/BANDAGES/DRESSINGS) IMPLANT
BNDG ESMARCH 4 X 12 STRL LF (GAUZE/BANDAGES/DRESSINGS) ×1
BNDG ESMARCH 4X12 STRL LF (GAUZE/BANDAGES/DRESSINGS) ×1 IMPLANT
CUFF TOURN SGL QUICK 18X4 (TOURNIQUET CUFF) ×1 IMPLANT
DRAPE U-SHAPE 47X51 STRL (DRAPES) ×1 IMPLANT
DRSG DERMACEA 8X12 NADH (GAUZE/BANDAGES/DRESSINGS) IMPLANT
DRSG NON-ADHERENT DERMACEA 3X4 (GAUZE/BANDAGES/DRESSINGS) ×1 IMPLANT
DURAPREP 26ML APPLICATOR (WOUND CARE) ×1 IMPLANT
ELECT CAUTERY BLADE 6.4 (BLADE) ×1 IMPLANT
ELECT REM PT RETURN 9FT ADLT (ELECTROSURGICAL) ×1
ELECTRODE REM PT RTRN 9FT ADLT (ELECTROSURGICAL) ×1 IMPLANT
GAUZE SPONGE 4X4 12PLY STRL (GAUZE/BANDAGES/DRESSINGS) ×1 IMPLANT
GLOVE BIOGEL M STRL SZ7.5 (GLOVE) ×1 IMPLANT
GLOVE SRG 8 PF TXTR STRL LF DI (GLOVE) ×1 IMPLANT
GLOVE SURG UNDER POLY LF SZ8 (GLOVE) ×1
GOWN STRL REUS W/ TWL LRG LVL3 (GOWN DISPOSABLE) ×2 IMPLANT
GOWN STRL REUS W/TWL LRG LVL3 (GOWN DISPOSABLE) ×2
KIT TURNOVER KIT A (KITS) ×1 IMPLANT
MANIFOLD NEPTUNE II (INSTRUMENTS) ×1 IMPLANT
NS IRRIG 500ML POUR BTL (IV SOLUTION) ×1 IMPLANT
PACK EXTREMITY ARMC (MISCELLANEOUS) ×1 IMPLANT
PAD CAST 3X4 CTTN HI CHSV (CAST SUPPLIES) ×1 IMPLANT
PADDING CAST COTTON 3X4 STRL (CAST SUPPLIES) ×1
SOL PREP PVP 2OZ (MISCELLANEOUS) ×1
SOLUTION PREP PVP 2OZ (MISCELLANEOUS) ×1 IMPLANT
SPLINT CAST 1 STEP 3X12 (MISCELLANEOUS) ×1 IMPLANT
STOCKINETTE 48X4 2 PLY STRL (GAUZE/BANDAGES/DRESSINGS) ×1 IMPLANT
STOCKINETTE STRL 4IN 9604848 (GAUZE/BANDAGES/DRESSINGS) ×1 IMPLANT
SUT ETHILON 5-0 FS-2 18 BLK (SUTURE) ×1 IMPLANT
TRAP FLUID SMOKE EVACUATOR (MISCELLANEOUS) ×1 IMPLANT
WATER STERILE IRR 500ML POUR (IV SOLUTION) ×1 IMPLANT

## 2023-01-31 NOTE — Op Note (Signed)
OPERATIVE NOTE  DATE OF SURGERY:  01/31/2023  PATIENT NAME:  Katherine Mosley   DOB: 07-14-1941  MRN: [id number]  PRE-OPERATIVE DIAGNOSIS: Left carpal tunnel syndrome  POST-OPERATIVE DIAGNOSIS:  Same  PROCEDURE:  Left carpal tunnel release  SURGEON:  Marciano Sequin. M.D.  ANESTHESIA: general  ESTIMATED BLOOD LOSS: Minimal  FLUIDS REPLACED: 400 mL of crystalloid  TOURNIQUET TIME: 31 minutes  DRAINS: None  INDICATIONS FOR SURGERY: Katherine Mosley is a 82 y.o. year old female with a long history of numbness and paresthesias to the left hand. EMG/nerve conduction studies demonstrated findings consistent with carpal tunnel syndrome.The patient had not seen any significant improvement despite conservative nonsurgical intervention. After discussion of the risks and benefits of surgical intervention, the patient expressed understanding of the risks benefits and agree with plans for carpal tunnel release.   PROCEDURE IN DETAIL: The patient was brought into the operating room and after adequate general anesthesia, a tourniquet was placed on the patient's left upper arm.The left hand and arm were prepped with alcohol and Duraprep and draped in the usual sterile fashion. A "time-out" was performed as per usual protocol. The hand and forearm were exsanguinated using an Esmarch and the tourniquet was inflated to 250 mmHg. Loupe magnification was used throughout the procedure. An incision was made just ulnar to the thenar palmar crease. Dissection was carried down through the palmar fascia to the transverse carpal ligament. The transverse carpal ligament was sharply incised, taking care to protect the underlying structures with the carpal tunnel. Complete release of the transverse carpal ligament was achieved. There was no evidence of ganglion cyst or lipoma within the carpal tunnel. The wound was irrigated with copious amounts of normal saline with antibiotic solution. The skin was then  re-approximated with interrupted sutures of #5-0 nylon. A sterile dressing was applied followed by application of a volar splint. The tourniquet was deflated with a total tourniquet time of 31 minutes.  The patient tolerated the procedure well and was transported to the PACU in stable condition.  Aaro Meyers P. Holley Bouche., M.D.

## 2023-01-31 NOTE — Anesthesia Postprocedure Evaluation (Signed)
Anesthesia Post Note  Patient: Nieve Duggins [place]  Procedure(s) Performed: CARPAL TUNNEL RELEASE (Left: Wrist)  Patient location during evaluation: PACU Anesthesia Type: General Level of consciousness: awake and alert Pain management: pain level controlled Vital Signs Assessment: post-procedure vital signs reviewed and stable Respiratory status: spontaneous breathing, nonlabored ventilation, respiratory function stable and patient connected to nasal cannula oxygen Cardiovascular status: blood pressure returned to baseline and stable Postop Assessment: no apparent nausea or vomiting Anesthetic complications: no   No notable events documented.   Last Vitals:  Vitals:   01/31/23 1715 01/31/23 1726  BP: (!) 141/74 (!) 145/64  Pulse: 70 76  Resp: 16 16  Temp:  (!) 36.1 C  SpO2: 93% 92%    Last Pain:  Vitals:   01/31/23 1726  TempSrc: Temporal  PainSc: 0-No pain                 Ilene Qua

## 2023-01-31 NOTE — Transfer of Care (Signed)
Immediate Anesthesia Transfer of Care Note  Patient: Katherine Mosley  Procedure(s) Performed: CARPAL TUNNEL RELEASE (Left: Wrist)  Patient Location: PACU  Anesthesia Type:General  Level of Consciousness: awake and drowsy  Airway & Oxygen Therapy: Patient Spontanous Breathing and Patient connected to face mask oxygen  Post-op Assessment: Report given to RN and Post -op Vital signs reviewed and stable  Post vital signs: Reviewed and stable  Last Vitals:  Vitals Value Taken Time  BP 123/61 01/31/23 1642  Temp    Pulse 69 01/31/23 1643  Resp 15 01/31/23 1643  SpO2 100 % 01/31/23 1643  Vitals shown include unvalidated device data.  Last Pain:  Vitals:   01/31/23 1327  TempSrc: Temporal  PainSc: 0-No pain         Complications: No notable events documented.

## 2023-01-31 NOTE — Anesthesia Procedure Notes (Signed)
Procedure Name: LMA Insertion Date/Time: 01/31/2023 3:33 PM  Performed by: Johnna Acosta, CRNAPre-anesthesia Checklist: Patient identified, Emergency Drugs available, Suction available, Patient being monitored and Timeout performed Patient Re-evaluated:Patient Re-evaluated prior to induction Oxygen Delivery Method: Circle system utilized Preoxygenation: Pre-oxygenation with 100% oxygen Induction Type: IV induction LMA: LMA inserted LMA Size: 4.0 Tube type: Oral Number of attempts: 2 Placement Confirmation: positive ETCO2 and breath sounds checked- equal and bilateral Tube secured with: Tape Dental Injury: Teeth and Oropharynx as per pre-operative assessment

## 2023-01-31 NOTE — Anesthesia Preprocedure Evaluation (Addendum)
Anesthesia Evaluation  Patient identified by MRN, date of birth, ID band Patient awake    Reviewed: Allergy & Precautions, NPO status , Patient's Chart, lab work & pertinent test results  History of Anesthesia Complications (+) PONV and history of anesthetic complications  Airway Mallampati: III   Neck ROM: Full    Dental  (+) Missing   Pulmonary COPD, former smoker (quit 2019)   Pulmonary exam normal breath sounds clear to auscultation       Cardiovascular hypertension, Normal cardiovascular exam Rhythm:Regular Rate:Normal  ECG 01/27/23:  Sinus rhythm with 1st degree A-V block Otherwise normal ECG  Echo 03/31/20:  NORMAL LEFT VENTRICULAR SYSTOLIC FUNCTION  NORMAL RIGHT VENTRICULAR SYSTOLIC FUNCTION  MILD VALVULAR REGURGITATION   NO VALVULAR STENOSIS   Myocardial perfusion 01/23/19:  Normal treadmill EKG without evidence of ischemia or arrhythmia  Normal myocardial perfusion without evidence of myocardial ischemia    Neuro/Psych Encephalitis s/p craniotomy 1980    GI/Hepatic ,GERD  ,,  Endo/Other  Obesity   Renal/GU Renal disease (stage III CKD, nephrolithiasis)     Musculoskeletal   Abdominal   Peds  Hematology negative hematology ROS (+)   Anesthesia Other Findings   Reproductive/Obstetrics                             Anesthesia Physical Anesthesia Plan  ASA: 2  Anesthesia Plan: General   Post-op Pain Management:    Induction: Intravenous  PONV Risk Score and Plan: 4 or greater and Ondansetron, Dexamethasone, Treatment may vary due to age or medical condition, Propofol infusion and TIVA  Airway Management Planned: LMA  Additional Equipment:   Intra-op Plan:   Post-operative Plan: Extubation in OR  Informed Consent: I have reviewed the patients History and Physical, chart, labs and discussed the procedure including the risks, benefits and alternatives for the proposed  anesthesia with the patient or authorized representative who has indicated his/her understanding and acceptance.     Dental advisory given  Plan Discussed with: CRNA  Anesthesia Plan Comments: (Patient consented for risks of anesthesia including but not limited to:  - adverse reactions to medications - damage to eyes, teeth, lips or other oral mucosa - nerve damage due to positioning  - sore throat or hoarseness - damage to heart, brain, nerves, lungs, other parts of body or loss of life  Informed patient about role of CRNA in peri- and intra-operative care.  Patient voiced understanding.)        Anesthesia Quick Evaluation

## 2023-02-01 ENCOUNTER — Encounter: Payer: Self-pay | Admitting: Orthopedic Surgery

## 2023-02-03 DIAGNOSIS — E538 Deficiency of other specified B group vitamins: Secondary | ICD-10-CM | POA: Diagnosis not present

## 2023-02-03 DIAGNOSIS — E785 Hyperlipidemia, unspecified: Secondary | ICD-10-CM | POA: Diagnosis not present

## 2023-02-03 DIAGNOSIS — R0609 Other forms of dyspnea: Secondary | ICD-10-CM | POA: Diagnosis not present

## 2023-02-03 DIAGNOSIS — Z79899 Other long term (current) drug therapy: Secondary | ICD-10-CM | POA: Diagnosis not present

## 2023-02-03 DIAGNOSIS — N189 Chronic kidney disease, unspecified: Secondary | ICD-10-CM | POA: Diagnosis not present

## 2023-02-03 DIAGNOSIS — Z0000 Encounter for general adult medical examination without abnormal findings: Secondary | ICD-10-CM | POA: Diagnosis not present

## 2023-02-03 DIAGNOSIS — I129 Hypertensive chronic kidney disease with stage 1 through stage 4 chronic kidney disease, or unspecified chronic kidney disease: Secondary | ICD-10-CM | POA: Diagnosis not present

## 2023-02-03 DIAGNOSIS — E559 Vitamin D deficiency, unspecified: Secondary | ICD-10-CM | POA: Diagnosis not present

## 2023-02-03 DIAGNOSIS — J449 Chronic obstructive pulmonary disease, unspecified: Secondary | ICD-10-CM | POA: Diagnosis not present

## 2023-03-15 DIAGNOSIS — Z9889 Other specified postprocedural states: Secondary | ICD-10-CM | POA: Diagnosis not present

## 2023-03-15 DIAGNOSIS — R0609 Other forms of dyspnea: Secondary | ICD-10-CM | POA: Diagnosis not present

## 2023-05-06 DIAGNOSIS — I10 Essential (primary) hypertension: Secondary | ICD-10-CM | POA: Diagnosis not present

## 2023-05-06 DIAGNOSIS — Z79899 Other long term (current) drug therapy: Secondary | ICD-10-CM | POA: Diagnosis not present

## 2023-05-06 DIAGNOSIS — E538 Deficiency of other specified B group vitamins: Secondary | ICD-10-CM | POA: Diagnosis not present

## 2023-05-06 DIAGNOSIS — E7800 Pure hypercholesterolemia, unspecified: Secondary | ICD-10-CM | POA: Diagnosis not present

## 2023-05-06 DIAGNOSIS — E559 Vitamin D deficiency, unspecified: Secondary | ICD-10-CM | POA: Diagnosis not present

## 2023-05-13 DIAGNOSIS — E782 Mixed hyperlipidemia: Secondary | ICD-10-CM | POA: Diagnosis not present

## 2023-05-13 DIAGNOSIS — E538 Deficiency of other specified B group vitamins: Secondary | ICD-10-CM | POA: Diagnosis not present

## 2023-05-13 DIAGNOSIS — I10 Essential (primary) hypertension: Secondary | ICD-10-CM | POA: Diagnosis not present

## 2023-05-13 DIAGNOSIS — R0600 Dyspnea, unspecified: Secondary | ICD-10-CM | POA: Diagnosis not present

## 2023-05-13 DIAGNOSIS — J9811 Atelectasis: Secondary | ICD-10-CM | POA: Diagnosis not present

## 2023-05-13 DIAGNOSIS — Z0000 Encounter for general adult medical examination without abnormal findings: Secondary | ICD-10-CM | POA: Diagnosis not present

## 2023-05-13 DIAGNOSIS — J431 Panlobular emphysema: Secondary | ICD-10-CM | POA: Diagnosis not present

## 2023-05-13 DIAGNOSIS — Z1211 Encounter for screening for malignant neoplasm of colon: Secondary | ICD-10-CM | POA: Diagnosis not present

## 2023-05-13 DIAGNOSIS — E559 Vitamin D deficiency, unspecified: Secondary | ICD-10-CM | POA: Diagnosis not present

## 2023-05-20 DIAGNOSIS — Z1211 Encounter for screening for malignant neoplasm of colon: Secondary | ICD-10-CM | POA: Diagnosis not present

## 2023-06-01 DIAGNOSIS — J431 Panlobular emphysema: Secondary | ICD-10-CM | POA: Diagnosis not present

## 2023-06-13 DIAGNOSIS — J431 Panlobular emphysema: Secondary | ICD-10-CM | POA: Diagnosis not present

## 2023-08-02 DIAGNOSIS — J449 Chronic obstructive pulmonary disease, unspecified: Secondary | ICD-10-CM | POA: Diagnosis not present

## 2023-08-15 DIAGNOSIS — E559 Vitamin D deficiency, unspecified: Secondary | ICD-10-CM | POA: Diagnosis not present

## 2023-08-15 DIAGNOSIS — I129 Hypertensive chronic kidney disease with stage 1 through stage 4 chronic kidney disease, or unspecified chronic kidney disease: Secondary | ICD-10-CM | POA: Diagnosis not present

## 2023-08-15 DIAGNOSIS — Z87891 Personal history of nicotine dependence: Secondary | ICD-10-CM | POA: Diagnosis not present

## 2023-08-15 DIAGNOSIS — N183 Chronic kidney disease, stage 3 unspecified: Secondary | ICD-10-CM | POA: Diagnosis not present

## 2023-08-15 DIAGNOSIS — Z1231 Encounter for screening mammogram for malignant neoplasm of breast: Secondary | ICD-10-CM | POA: Diagnosis not present

## 2023-08-15 DIAGNOSIS — J431 Panlobular emphysema: Secondary | ICD-10-CM | POA: Diagnosis not present

## 2023-08-15 DIAGNOSIS — Z79899 Other long term (current) drug therapy: Secondary | ICD-10-CM | POA: Diagnosis not present

## 2023-08-15 DIAGNOSIS — R252 Cramp and spasm: Secondary | ICD-10-CM | POA: Diagnosis not present

## 2023-08-15 DIAGNOSIS — E782 Mixed hyperlipidemia: Secondary | ICD-10-CM | POA: Diagnosis not present

## 2023-08-16 ENCOUNTER — Other Ambulatory Visit: Payer: Self-pay | Admitting: Internal Medicine

## 2023-08-16 DIAGNOSIS — Z1231 Encounter for screening mammogram for malignant neoplasm of breast: Secondary | ICD-10-CM

## 2023-11-01 DIAGNOSIS — J811 Chronic pulmonary edema: Secondary | ICD-10-CM | POA: Diagnosis not present

## 2023-11-08 DIAGNOSIS — J811 Chronic pulmonary edema: Secondary | ICD-10-CM | POA: Diagnosis not present

## 2023-11-09 ENCOUNTER — Other Ambulatory Visit: Payer: Self-pay | Admitting: Emergency Medicine

## 2023-11-09 ENCOUNTER — Ambulatory Visit
Admission: RE | Admit: 2023-11-09 | Discharge: 2023-11-09 | Disposition: A | Discharge status: Home / Self Care | Payer: Medicare HMO | Source: Ambulatory Visit | Attending: Emergency Medicine | Admitting: Emergency Medicine

## 2023-11-09 DIAGNOSIS — R7989 Other specified abnormal findings of blood chemistry: Secondary | ICD-10-CM

## 2023-11-09 DIAGNOSIS — I700 Atherosclerosis of aorta: Secondary | ICD-10-CM | POA: Diagnosis not present

## 2023-11-09 DIAGNOSIS — I517 Cardiomegaly: Secondary | ICD-10-CM | POA: Diagnosis not present

## 2023-11-09 DIAGNOSIS — I2510 Atherosclerotic heart disease of native coronary artery without angina pectoris: Secondary | ICD-10-CM | POA: Diagnosis not present

## 2023-11-09 DIAGNOSIS — R0602 Shortness of breath: Secondary | ICD-10-CM | POA: Diagnosis not present

## 2023-11-09 MED ORDER — IOHEXOL 350 MG/ML SOLN
75.0000 mL | Freq: Once | INTRAVENOUS | Status: AC | PRN
  Administered 2023-11-09: 75 mL via INTRAVENOUS

## 2023-11-25 DIAGNOSIS — I129 Hypertensive chronic kidney disease with stage 1 through stage 4 chronic kidney disease, or unspecified chronic kidney disease: Secondary | ICD-10-CM | POA: Diagnosis not present

## 2023-11-25 DIAGNOSIS — Z87891 Personal history of nicotine dependence: Secondary | ICD-10-CM | POA: Diagnosis not present

## 2023-11-25 DIAGNOSIS — E785 Hyperlipidemia, unspecified: Secondary | ICD-10-CM | POA: Diagnosis not present

## 2023-11-25 DIAGNOSIS — Z79899 Other long term (current) drug therapy: Secondary | ICD-10-CM | POA: Diagnosis not present

## 2023-11-25 DIAGNOSIS — Z0000 Encounter for general adult medical examination without abnormal findings: Secondary | ICD-10-CM | POA: Diagnosis not present

## 2023-11-25 DIAGNOSIS — N183 Chronic kidney disease, stage 3 unspecified: Secondary | ICD-10-CM | POA: Diagnosis not present

## 2023-12-05 ENCOUNTER — Ambulatory Visit
Admission: RE | Admit: 2023-12-05 | Discharge: 2023-12-05 | Disposition: A | Discharge status: Home / Self Care | Payer: Medicare HMO | Source: Ambulatory Visit | Attending: Internal Medicine | Admitting: Internal Medicine

## 2023-12-05 DIAGNOSIS — Z1231 Encounter for screening mammogram for malignant neoplasm of breast: Secondary | ICD-10-CM | POA: Insufficient documentation

## 2023-12-22 DIAGNOSIS — R42 Dizziness and giddiness: Secondary | ICD-10-CM | POA: Diagnosis not present

## 2023-12-22 DIAGNOSIS — N183 Chronic kidney disease, stage 3 unspecified: Secondary | ICD-10-CM | POA: Diagnosis not present

## 2024-01-30 DIAGNOSIS — R0600 Dyspnea, unspecified: Secondary | ICD-10-CM | POA: Diagnosis not present

## 2024-01-30 DIAGNOSIS — R0609 Other forms of dyspnea: Secondary | ICD-10-CM | POA: Diagnosis not present

## 2024-01-30 DIAGNOSIS — R600 Localized edema: Secondary | ICD-10-CM | POA: Diagnosis not present

## 2024-01-30 DIAGNOSIS — M25569 Pain in unspecified knee: Secondary | ICD-10-CM | POA: Diagnosis not present

## 2024-01-30 DIAGNOSIS — R0689 Other abnormalities of breathing: Secondary | ICD-10-CM | POA: Diagnosis not present

## 2024-02-29 DIAGNOSIS — I129 Hypertensive chronic kidney disease with stage 1 through stage 4 chronic kidney disease, or unspecified chronic kidney disease: Secondary | ICD-10-CM | POA: Diagnosis not present

## 2024-02-29 DIAGNOSIS — E7800 Pure hypercholesterolemia, unspecified: Secondary | ICD-10-CM | POA: Diagnosis not present

## 2024-02-29 DIAGNOSIS — E559 Vitamin D deficiency, unspecified: Secondary | ICD-10-CM | POA: Diagnosis not present

## 2024-02-29 DIAGNOSIS — N183 Chronic kidney disease, stage 3 unspecified: Secondary | ICD-10-CM | POA: Diagnosis not present

## 2024-02-29 DIAGNOSIS — J431 Panlobular emphysema: Secondary | ICD-10-CM | POA: Diagnosis not present

## 2024-02-29 DIAGNOSIS — Z79899 Other long term (current) drug therapy: Secondary | ICD-10-CM | POA: Diagnosis not present

## 2024-02-29 DIAGNOSIS — Z87891 Personal history of nicotine dependence: Secondary | ICD-10-CM | POA: Diagnosis not present

## 2024-03-13 DIAGNOSIS — M170 Bilateral primary osteoarthritis of knee: Secondary | ICD-10-CM | POA: Diagnosis not present

## 2024-03-13 DIAGNOSIS — G8929 Other chronic pain: Secondary | ICD-10-CM | POA: Diagnosis not present

## 2024-05-31 ENCOUNTER — Ambulatory Visit: Payer: Medicare HMO | Admitting: Dermatology

## 2024-06-14 DIAGNOSIS — M19032 Primary osteoarthritis, left wrist: Secondary | ICD-10-CM | POA: Diagnosis not present

## 2024-06-14 DIAGNOSIS — M25561 Pain in right knee: Secondary | ICD-10-CM | POA: Diagnosis not present

## 2024-06-14 DIAGNOSIS — M25562 Pain in left knee: Secondary | ICD-10-CM | POA: Diagnosis not present

## 2024-06-14 DIAGNOSIS — G8929 Other chronic pain: Secondary | ICD-10-CM | POA: Diagnosis not present

## 2024-06-18 ENCOUNTER — Ambulatory Visit: Admitting: Dermatology

## 2024-06-18 DIAGNOSIS — Z85828 Personal history of other malignant neoplasm of skin: Secondary | ICD-10-CM | POA: Diagnosis not present

## 2024-06-18 DIAGNOSIS — L820 Inflamed seborrheic keratosis: Secondary | ICD-10-CM

## 2024-06-18 DIAGNOSIS — W908XXA Exposure to other nonionizing radiation, initial encounter: Secondary | ICD-10-CM

## 2024-06-18 DIAGNOSIS — L821 Other seborrheic keratosis: Secondary | ICD-10-CM | POA: Diagnosis not present

## 2024-06-18 DIAGNOSIS — D1801 Hemangioma of skin and subcutaneous tissue: Secondary | ICD-10-CM | POA: Diagnosis not present

## 2024-06-18 DIAGNOSIS — I8390 Asymptomatic varicose veins of unspecified lower extremity: Secondary | ICD-10-CM

## 2024-06-18 DIAGNOSIS — L578 Other skin changes due to chronic exposure to nonionizing radiation: Secondary | ICD-10-CM | POA: Diagnosis not present

## 2024-06-18 DIAGNOSIS — L720 Epidermal cyst: Secondary | ICD-10-CM

## 2024-06-18 DIAGNOSIS — L729 Follicular cyst of the skin and subcutaneous tissue, unspecified: Secondary | ICD-10-CM

## 2024-06-18 NOTE — Patient Instructions (Signed)

## 2024-06-18 NOTE — Progress Notes (Unsigned)
   New Patient Visit   Subjective  Katherine Mosley is a 83 y.o. female who presents for the following: Lesions, irregular appearing, on the back, R hip, and lower legs. Patient concerned and would like checked today.  Patient accompanied by daughter who contributes to history.  The following portions of the chart were reviewed this encounter and updated as appropriate: medications, allergies, medical history  Review of Systems:  No other skin or systemic complaints except as noted in HPI or Assessment and Plan.  Objective  Well appearing patient in no apparent distress; mood and affect are within normal limits.   A focused examination was performed of the following areas: the face, trunk, and extremities.   Relevant exam findings are noted in the Assessment and Plan.  R hip x 1, R back x 1, epigastric x 1 (3) Erythematous stuck-on, waxy papule or plaque  Assessment & Plan   SEBORRHEIC KERATOSIS - Stuck-on, waxy, tan-brown papules and/or plaques  - Benign-appearing - Discussed benign etiology and prognosis. - Observe - Call for any changes  ACTINIC DAMAGE - chronic, secondary to cumulative UV radiation exposure/sun exposure over time - diffuse scaly erythematous macules with underlying dyspigmentation - Recommend daily broad spectrum sunscreen SPF 30+ to sun-exposed areas, reapply every 2 hours as needed.  - Recommend staying in the shade or wearing long sleeves, sun glasses (UVA+UVB protection) and wide brim hats (4-inch brim around the entire circumference of the hat). - Call for new or changing lesions.  HEMANGIOMA Exam: red papule(s) Discussed benign nature. Recommend observation. Call for changes.  INFLAMED SEBORRHEIC KERATOSIS (3) R hip x 1, R back x 1, epigastric x 1 (3) Symptomatic, irritating, patient would like treated.  Destruction of lesion - R hip x 1, R back x 1, epigastric x 1 (3) Complexity: simple   Destruction method: cryotherapy   Informed  consent: discussed and consent obtained   Timeout:  patient name, date of birth, surgical site, and procedure verified Lesion destroyed using liquid nitrogen: Yes   Region frozen until ice ball extended beyond lesion: Yes   Outcome: patient tolerated procedure well with no complications   Post-procedure details: wound care instructions given     Milia - tiny firm white papules - type of cyst - benign - sometimes these will clear with nightly OTC adapalene/Differin 0.1% gel or retinol. - may be extracted if symptomatic - observe  HISTORY OF BASAL CELL CARCINOMA OF THE SKIN - R med canthus - No evidence of recurrence today - Recommend regular full body skin exams - Recommend daily broad spectrum sunscreen SPF 30+ to sun-exposed areas, reapply every 2 hours as needed.  - Call if any new or changing lesions are noted between office visits  Varicose Veins/Spider Veins - Dilated blue, purple or red veins at the lower extremities - Reassured - Smaller vessels can be treated by sclerotherapy (a procedure to inject a medicine into the veins to make them disappear) if desired, but the treatment is not covered by insurance. Larger vessels may be covered if symptomatic and we would refer to vascular surgeon if treatment desired.  Return if symptoms worsen or fail to improve.  LILLETTE Rosina Mayans, CMA, am acting as scribe for Alm Rhyme, MD .   Documentation: I have reviewed the above documentation for accuracy and completeness, and I agree with the above.  Alm Rhyme, MD

## 2024-06-19 ENCOUNTER — Encounter: Payer: Self-pay | Admitting: Dermatology

## 2024-06-29 DIAGNOSIS — E538 Deficiency of other specified B group vitamins: Secondary | ICD-10-CM | POA: Diagnosis not present

## 2024-06-29 DIAGNOSIS — Z0000 Encounter for general adult medical examination without abnormal findings: Secondary | ICD-10-CM | POA: Diagnosis not present

## 2024-06-29 DIAGNOSIS — E782 Mixed hyperlipidemia: Secondary | ICD-10-CM | POA: Diagnosis not present

## 2024-06-29 DIAGNOSIS — I10 Essential (primary) hypertension: Secondary | ICD-10-CM | POA: Diagnosis not present

## 2024-06-29 DIAGNOSIS — Z79899 Other long term (current) drug therapy: Secondary | ICD-10-CM | POA: Diagnosis not present

## 2024-06-29 DIAGNOSIS — Z1331 Encounter for screening for depression: Secondary | ICD-10-CM | POA: Diagnosis not present

## 2024-06-29 DIAGNOSIS — Z1211 Encounter for screening for malignant neoplasm of colon: Secondary | ICD-10-CM | POA: Diagnosis not present

## 2024-06-29 DIAGNOSIS — E559 Vitamin D deficiency, unspecified: Secondary | ICD-10-CM | POA: Diagnosis not present

## 2024-07-06 DIAGNOSIS — Z1211 Encounter for screening for malignant neoplasm of colon: Secondary | ICD-10-CM | POA: Diagnosis not present

## 2024-08-01 DIAGNOSIS — R600 Localized edema: Secondary | ICD-10-CM | POA: Diagnosis not present

## 2024-08-01 DIAGNOSIS — Z79899 Other long term (current) drug therapy: Secondary | ICD-10-CM | POA: Diagnosis not present

## 2024-08-01 DIAGNOSIS — R0689 Other abnormalities of breathing: Secondary | ICD-10-CM | POA: Diagnosis not present

## 2024-08-01 DIAGNOSIS — J984 Other disorders of lung: Secondary | ICD-10-CM | POA: Diagnosis not present

## 2024-08-01 DIAGNOSIS — R0600 Dyspnea, unspecified: Secondary | ICD-10-CM | POA: Diagnosis not present

## 2024-08-22 DIAGNOSIS — H6121 Impacted cerumen, right ear: Secondary | ICD-10-CM | POA: Diagnosis not present

## 2024-08-22 DIAGNOSIS — J431 Panlobular emphysema: Secondary | ICD-10-CM | POA: Diagnosis not present

## 2024-08-22 DIAGNOSIS — N183 Chronic kidney disease, stage 3 unspecified: Secondary | ICD-10-CM | POA: Diagnosis not present

## 2024-09-03 DIAGNOSIS — Z79899 Other long term (current) drug therapy: Secondary | ICD-10-CM | POA: Diagnosis not present

## 2024-09-13 DIAGNOSIS — H6121 Impacted cerumen, right ear: Secondary | ICD-10-CM | POA: Diagnosis not present

## 2024-09-13 DIAGNOSIS — J310 Chronic rhinitis: Secondary | ICD-10-CM | POA: Diagnosis not present

## 2024-10-30 ENCOUNTER — Other Ambulatory Visit: Payer: Self-pay | Admitting: Internal Medicine

## 2024-10-30 DIAGNOSIS — Z1231 Encounter for screening mammogram for malignant neoplasm of breast: Secondary | ICD-10-CM

## 2024-12-06 ENCOUNTER — Ambulatory Visit
Admission: RE | Admit: 2024-12-06 | Discharge: 2024-12-06 | Disposition: A | Discharge status: Home / Self Care | Source: Ambulatory Visit | Attending: Internal Medicine | Admitting: Internal Medicine

## 2024-12-06 DIAGNOSIS — Z1231 Encounter for screening mammogram for malignant neoplasm of breast: Secondary | ICD-10-CM | POA: Diagnosis present
